# Patient Record
Sex: Female | Born: 1965 | Race: White | Hispanic: No | Marital: Married | Smoking: Current every day smoker
Health system: Southern US, Community
[De-identification: ages and names within clinical notes are randomized; demographics above are authoritative.]

## PROBLEM LIST (undated history)

## (undated) DIAGNOSIS — M0520 Rheumatoid vasculitis with rheumatoid arthritis of unspecified site: Secondary | ICD-10-CM

---

## 2017-05-27 DIAGNOSIS — Z13220 Encounter for screening for lipoid disorders: Secondary | ICD-10-CM | POA: Diagnosis not present

## 2017-05-27 DIAGNOSIS — M069 Rheumatoid arthritis, unspecified: Secondary | ICD-10-CM | POA: Diagnosis not present

## 2017-05-27 DIAGNOSIS — R69 Illness, unspecified: Secondary | ICD-10-CM | POA: Diagnosis not present

## 2017-05-27 DIAGNOSIS — G4700 Insomnia, unspecified: Secondary | ICD-10-CM | POA: Diagnosis not present

## 2017-06-28 DIAGNOSIS — Z79891 Long term (current) use of opiate analgesic: Secondary | ICD-10-CM | POA: Diagnosis not present

## 2017-06-28 DIAGNOSIS — M542 Cervicalgia: Secondary | ICD-10-CM | POA: Diagnosis not present

## 2017-06-28 DIAGNOSIS — G894 Chronic pain syndrome: Secondary | ICD-10-CM | POA: Diagnosis not present

## 2017-06-28 DIAGNOSIS — Z79899 Other long term (current) drug therapy: Secondary | ICD-10-CM | POA: Diagnosis not present

## 2017-07-10 DIAGNOSIS — R5383 Other fatigue: Secondary | ICD-10-CM | POA: Diagnosis not present

## 2017-07-10 DIAGNOSIS — R69 Illness, unspecified: Secondary | ICD-10-CM | POA: Diagnosis not present

## 2017-07-10 DIAGNOSIS — Z5181 Encounter for therapeutic drug level monitoring: Secondary | ICD-10-CM | POA: Diagnosis not present

## 2017-07-10 DIAGNOSIS — E782 Mixed hyperlipidemia: Secondary | ICD-10-CM | POA: Diagnosis not present

## 2017-07-10 DIAGNOSIS — L509 Urticaria, unspecified: Secondary | ICD-10-CM | POA: Diagnosis not present

## 2017-07-16 ENCOUNTER — Other Ambulatory Visit: Payer: Self-pay | Admitting: Physician Assistant

## 2017-07-16 ENCOUNTER — Ambulatory Visit
Admission: RE | Admit: 2017-07-16 | Discharge: 2017-07-16 | Disposition: A | Discharge status: Home / Self Care | Payer: Medicare HMO | Source: Ambulatory Visit | Attending: Physician Assistant | Admitting: Physician Assistant

## 2017-07-16 DIAGNOSIS — M542 Cervicalgia: Secondary | ICD-10-CM

## 2017-07-16 DIAGNOSIS — M4802 Spinal stenosis, cervical region: Secondary | ICD-10-CM | POA: Diagnosis not present

## 2017-07-16 DIAGNOSIS — M19041 Primary osteoarthritis, right hand: Secondary | ICD-10-CM | POA: Diagnosis not present

## 2017-07-16 DIAGNOSIS — M12842 Other specific arthropathies, not elsewhere classified, left hand: Secondary | ICD-10-CM | POA: Diagnosis not present

## 2017-07-18 DIAGNOSIS — M064 Inflammatory polyarthropathy: Secondary | ICD-10-CM | POA: Diagnosis not present

## 2017-07-18 DIAGNOSIS — M79643 Pain in unspecified hand: Secondary | ICD-10-CM | POA: Diagnosis not present

## 2017-07-18 DIAGNOSIS — M0579 Rheumatoid arthritis with rheumatoid factor of multiple sites without organ or systems involvement: Secondary | ICD-10-CM | POA: Diagnosis not present

## 2017-07-18 DIAGNOSIS — Z79899 Other long term (current) drug therapy: Secondary | ICD-10-CM | POA: Diagnosis not present

## 2017-07-18 DIAGNOSIS — L509 Urticaria, unspecified: Secondary | ICD-10-CM | POA: Diagnosis not present

## 2017-07-18 DIAGNOSIS — M0580 Other rheumatoid arthritis with rheumatoid factor of unspecified site: Secondary | ICD-10-CM | POA: Diagnosis not present

## 2017-07-30 DIAGNOSIS — Z79891 Long term (current) use of opiate analgesic: Secondary | ICD-10-CM | POA: Diagnosis not present

## 2017-07-30 DIAGNOSIS — Z79899 Other long term (current) drug therapy: Secondary | ICD-10-CM | POA: Diagnosis not present

## 2017-07-30 DIAGNOSIS — M542 Cervicalgia: Secondary | ICD-10-CM | POA: Diagnosis not present

## 2017-07-30 DIAGNOSIS — M47812 Spondylosis without myelopathy or radiculopathy, cervical region: Secondary | ICD-10-CM | POA: Diagnosis not present

## 2017-07-30 DIAGNOSIS — M069 Rheumatoid arthritis, unspecified: Secondary | ICD-10-CM | POA: Diagnosis not present

## 2017-07-30 DIAGNOSIS — G894 Chronic pain syndrome: Secondary | ICD-10-CM | POA: Diagnosis not present

## 2017-08-14 DIAGNOSIS — R5383 Other fatigue: Secondary | ICD-10-CM | POA: Diagnosis not present

## 2017-08-14 DIAGNOSIS — Z5181 Encounter for therapeutic drug level monitoring: Secondary | ICD-10-CM | POA: Diagnosis not present

## 2017-08-14 DIAGNOSIS — E782 Mixed hyperlipidemia: Secondary | ICD-10-CM | POA: Diagnosis not present

## 2017-08-21 DIAGNOSIS — J301 Allergic rhinitis due to pollen: Secondary | ICD-10-CM | POA: Diagnosis not present

## 2017-08-21 DIAGNOSIS — E782 Mixed hyperlipidemia: Secondary | ICD-10-CM | POA: Diagnosis not present

## 2017-08-27 DIAGNOSIS — M47812 Spondylosis without myelopathy or radiculopathy, cervical region: Secondary | ICD-10-CM | POA: Diagnosis not present

## 2017-08-27 DIAGNOSIS — G894 Chronic pain syndrome: Secondary | ICD-10-CM | POA: Diagnosis not present

## 2017-08-27 DIAGNOSIS — Z79899 Other long term (current) drug therapy: Secondary | ICD-10-CM | POA: Diagnosis not present

## 2017-08-27 DIAGNOSIS — M069 Rheumatoid arthritis, unspecified: Secondary | ICD-10-CM | POA: Diagnosis not present

## 2017-08-27 DIAGNOSIS — Z79891 Long term (current) use of opiate analgesic: Secondary | ICD-10-CM | POA: Diagnosis not present

## 2017-08-27 DIAGNOSIS — M542 Cervicalgia: Secondary | ICD-10-CM | POA: Diagnosis not present

## 2017-09-04 DIAGNOSIS — R69 Illness, unspecified: Secondary | ICD-10-CM | POA: Diagnosis not present

## 2017-09-17 DIAGNOSIS — L509 Urticaria, unspecified: Secondary | ICD-10-CM | POA: Diagnosis not present

## 2017-09-17 DIAGNOSIS — Z79899 Other long term (current) drug therapy: Secondary | ICD-10-CM | POA: Diagnosis not present

## 2017-09-17 DIAGNOSIS — M79643 Pain in unspecified hand: Secondary | ICD-10-CM | POA: Diagnosis not present

## 2017-09-17 DIAGNOSIS — M1990 Unspecified osteoarthritis, unspecified site: Secondary | ICD-10-CM | POA: Diagnosis not present

## 2017-09-17 DIAGNOSIS — M0579 Rheumatoid arthritis with rheumatoid factor of multiple sites without organ or systems involvement: Secondary | ICD-10-CM | POA: Diagnosis not present

## 2017-09-17 DIAGNOSIS — R110 Nausea: Secondary | ICD-10-CM | POA: Diagnosis not present

## 2017-09-24 DIAGNOSIS — M47812 Spondylosis without myelopathy or radiculopathy, cervical region: Secondary | ICD-10-CM | POA: Diagnosis not present

## 2017-09-24 DIAGNOSIS — G894 Chronic pain syndrome: Secondary | ICD-10-CM | POA: Diagnosis not present

## 2017-09-24 DIAGNOSIS — M542 Cervicalgia: Secondary | ICD-10-CM | POA: Diagnosis not present

## 2017-09-24 DIAGNOSIS — M069 Rheumatoid arthritis, unspecified: Secondary | ICD-10-CM | POA: Diagnosis not present

## 2017-10-22 DIAGNOSIS — M069 Rheumatoid arthritis, unspecified: Secondary | ICD-10-CM | POA: Diagnosis not present

## 2017-10-22 DIAGNOSIS — Z79891 Long term (current) use of opiate analgesic: Secondary | ICD-10-CM | POA: Diagnosis not present

## 2017-10-22 DIAGNOSIS — Z79899 Other long term (current) drug therapy: Secondary | ICD-10-CM | POA: Diagnosis not present

## 2017-10-22 DIAGNOSIS — G894 Chronic pain syndrome: Secondary | ICD-10-CM | POA: Diagnosis not present

## 2017-10-22 DIAGNOSIS — M47812 Spondylosis without myelopathy or radiculopathy, cervical region: Secondary | ICD-10-CM | POA: Diagnosis not present

## 2017-10-22 DIAGNOSIS — M542 Cervicalgia: Secondary | ICD-10-CM | POA: Diagnosis not present

## 2017-11-06 ENCOUNTER — Other Ambulatory Visit: Payer: Self-pay | Admitting: Internal Medicine

## 2017-11-06 DIAGNOSIS — Z139 Encounter for screening, unspecified: Secondary | ICD-10-CM

## 2017-11-20 DIAGNOSIS — M542 Cervicalgia: Secondary | ICD-10-CM | POA: Diagnosis not present

## 2017-11-20 DIAGNOSIS — M069 Rheumatoid arthritis, unspecified: Secondary | ICD-10-CM | POA: Diagnosis not present

## 2017-11-20 DIAGNOSIS — Z79899 Other long term (current) drug therapy: Secondary | ICD-10-CM | POA: Diagnosis not present

## 2017-11-20 DIAGNOSIS — G894 Chronic pain syndrome: Secondary | ICD-10-CM | POA: Diagnosis not present

## 2017-11-20 DIAGNOSIS — M47812 Spondylosis without myelopathy or radiculopathy, cervical region: Secondary | ICD-10-CM | POA: Diagnosis not present

## 2017-11-20 DIAGNOSIS — Z79891 Long term (current) use of opiate analgesic: Secondary | ICD-10-CM | POA: Diagnosis not present

## 2017-12-02 ENCOUNTER — Ambulatory Visit: Payer: Medicare HMO

## 2017-12-04 ENCOUNTER — Ambulatory Visit
Admission: RE | Admit: 2017-12-04 | Discharge: 2017-12-04 | Disposition: A | Discharge status: Home / Self Care | Payer: Medicare HMO | Source: Ambulatory Visit | Attending: Internal Medicine | Admitting: Internal Medicine

## 2017-12-04 DIAGNOSIS — Z1231 Encounter for screening mammogram for malignant neoplasm of breast: Secondary | ICD-10-CM | POA: Diagnosis not present

## 2017-12-04 DIAGNOSIS — Z139 Encounter for screening, unspecified: Secondary | ICD-10-CM

## 2017-12-17 DIAGNOSIS — M79603 Pain in arm, unspecified: Secondary | ICD-10-CM | POA: Diagnosis not present

## 2017-12-17 DIAGNOSIS — M1990 Unspecified osteoarthritis, unspecified site: Secondary | ICD-10-CM | POA: Diagnosis not present

## 2017-12-17 DIAGNOSIS — R110 Nausea: Secondary | ICD-10-CM | POA: Diagnosis not present

## 2017-12-17 DIAGNOSIS — M79643 Pain in unspecified hand: Secondary | ICD-10-CM | POA: Diagnosis not present

## 2017-12-17 DIAGNOSIS — Z79899 Other long term (current) drug therapy: Secondary | ICD-10-CM | POA: Diagnosis not present

## 2017-12-17 DIAGNOSIS — L509 Urticaria, unspecified: Secondary | ICD-10-CM | POA: Diagnosis not present

## 2017-12-17 DIAGNOSIS — M542 Cervicalgia: Secondary | ICD-10-CM | POA: Diagnosis not present

## 2017-12-17 DIAGNOSIS — M0579 Rheumatoid arthritis with rheumatoid factor of multiple sites without organ or systems involvement: Secondary | ICD-10-CM | POA: Diagnosis not present

## 2017-12-18 DIAGNOSIS — G894 Chronic pain syndrome: Secondary | ICD-10-CM | POA: Diagnosis not present

## 2017-12-18 DIAGNOSIS — M542 Cervicalgia: Secondary | ICD-10-CM | POA: Diagnosis not present

## 2017-12-18 DIAGNOSIS — Z79891 Long term (current) use of opiate analgesic: Secondary | ICD-10-CM | POA: Diagnosis not present

## 2017-12-18 DIAGNOSIS — M47812 Spondylosis without myelopathy or radiculopathy, cervical region: Secondary | ICD-10-CM | POA: Diagnosis not present

## 2017-12-18 DIAGNOSIS — M069 Rheumatoid arthritis, unspecified: Secondary | ICD-10-CM | POA: Diagnosis not present

## 2017-12-18 DIAGNOSIS — Z79899 Other long term (current) drug therapy: Secondary | ICD-10-CM | POA: Diagnosis not present

## 2018-01-03 DIAGNOSIS — E782 Mixed hyperlipidemia: Secondary | ICD-10-CM | POA: Diagnosis not present

## 2018-01-08 DIAGNOSIS — E6609 Other obesity due to excess calories: Secondary | ICD-10-CM | POA: Diagnosis not present

## 2018-01-08 DIAGNOSIS — E782 Mixed hyperlipidemia: Secondary | ICD-10-CM | POA: Diagnosis not present

## 2018-01-08 DIAGNOSIS — Z79899 Other long term (current) drug therapy: Secondary | ICD-10-CM | POA: Diagnosis not present

## 2018-01-08 DIAGNOSIS — R69 Illness, unspecified: Secondary | ICD-10-CM | POA: Diagnosis not present

## 2018-01-08 DIAGNOSIS — L821 Other seborrheic keratosis: Secondary | ICD-10-CM | POA: Diagnosis not present

## 2018-01-08 DIAGNOSIS — G44209 Tension-type headache, unspecified, not intractable: Secondary | ICD-10-CM | POA: Diagnosis not present

## 2018-01-15 DIAGNOSIS — M542 Cervicalgia: Secondary | ICD-10-CM | POA: Diagnosis not present

## 2018-01-15 DIAGNOSIS — G894 Chronic pain syndrome: Secondary | ICD-10-CM | POA: Diagnosis not present

## 2018-01-15 DIAGNOSIS — M47812 Spondylosis without myelopathy or radiculopathy, cervical region: Secondary | ICD-10-CM | POA: Diagnosis not present

## 2018-01-15 DIAGNOSIS — M069 Rheumatoid arthritis, unspecified: Secondary | ICD-10-CM | POA: Diagnosis not present

## 2018-01-21 DIAGNOSIS — H25013 Cortical age-related cataract, bilateral: Secondary | ICD-10-CM | POA: Diagnosis not present

## 2018-01-21 DIAGNOSIS — H3509 Other intraretinal microvascular abnormalities: Secondary | ICD-10-CM | POA: Diagnosis not present

## 2018-01-21 DIAGNOSIS — H2513 Age-related nuclear cataract, bilateral: Secondary | ICD-10-CM | POA: Diagnosis not present

## 2018-01-21 DIAGNOSIS — H53021 Refractive amblyopia, right eye: Secondary | ICD-10-CM | POA: Diagnosis not present

## 2018-01-31 DIAGNOSIS — R110 Nausea: Secondary | ICD-10-CM | POA: Diagnosis not present

## 2018-01-31 DIAGNOSIS — M79643 Pain in unspecified hand: Secondary | ICD-10-CM | POA: Diagnosis not present

## 2018-01-31 DIAGNOSIS — M0579 Rheumatoid arthritis with rheumatoid factor of multiple sites without organ or systems involvement: Secondary | ICD-10-CM | POA: Diagnosis not present

## 2018-01-31 DIAGNOSIS — Z79899 Other long term (current) drug therapy: Secondary | ICD-10-CM | POA: Diagnosis not present

## 2018-01-31 DIAGNOSIS — M1990 Unspecified osteoarthritis, unspecified site: Secondary | ICD-10-CM | POA: Diagnosis not present

## 2018-01-31 DIAGNOSIS — M79673 Pain in unspecified foot: Secondary | ICD-10-CM | POA: Diagnosis not present

## 2018-01-31 DIAGNOSIS — M79603 Pain in arm, unspecified: Secondary | ICD-10-CM | POA: Diagnosis not present

## 2018-01-31 DIAGNOSIS — M542 Cervicalgia: Secondary | ICD-10-CM | POA: Diagnosis not present

## 2018-01-31 DIAGNOSIS — L509 Urticaria, unspecified: Secondary | ICD-10-CM | POA: Diagnosis not present

## 2018-02-13 DIAGNOSIS — Z79899 Other long term (current) drug therapy: Secondary | ICD-10-CM | POA: Diagnosis not present

## 2018-02-13 DIAGNOSIS — M542 Cervicalgia: Secondary | ICD-10-CM | POA: Diagnosis not present

## 2018-02-13 DIAGNOSIS — M47812 Spondylosis without myelopathy or radiculopathy, cervical region: Secondary | ICD-10-CM | POA: Diagnosis not present

## 2018-02-13 DIAGNOSIS — M069 Rheumatoid arthritis, unspecified: Secondary | ICD-10-CM | POA: Diagnosis not present

## 2018-02-13 DIAGNOSIS — G894 Chronic pain syndrome: Secondary | ICD-10-CM | POA: Diagnosis not present

## 2018-02-13 DIAGNOSIS — Z79891 Long term (current) use of opiate analgesic: Secondary | ICD-10-CM | POA: Diagnosis not present

## 2018-03-03 DIAGNOSIS — M542 Cervicalgia: Secondary | ICD-10-CM | POA: Diagnosis not present

## 2018-03-05 DIAGNOSIS — M542 Cervicalgia: Secondary | ICD-10-CM | POA: Diagnosis not present

## 2018-03-12 DIAGNOSIS — M542 Cervicalgia: Secondary | ICD-10-CM | POA: Diagnosis not present

## 2018-03-13 DIAGNOSIS — G894 Chronic pain syndrome: Secondary | ICD-10-CM | POA: Diagnosis not present

## 2018-03-13 DIAGNOSIS — M542 Cervicalgia: Secondary | ICD-10-CM | POA: Diagnosis not present

## 2018-03-13 DIAGNOSIS — M069 Rheumatoid arthritis, unspecified: Secondary | ICD-10-CM | POA: Diagnosis not present

## 2018-03-13 DIAGNOSIS — M47812 Spondylosis without myelopathy or radiculopathy, cervical region: Secondary | ICD-10-CM | POA: Diagnosis not present

## 2018-03-17 DIAGNOSIS — M542 Cervicalgia: Secondary | ICD-10-CM | POA: Diagnosis not present

## 2018-03-20 DIAGNOSIS — M542 Cervicalgia: Secondary | ICD-10-CM | POA: Diagnosis not present

## 2018-04-03 DIAGNOSIS — M542 Cervicalgia: Secondary | ICD-10-CM | POA: Diagnosis not present

## 2018-04-04 DIAGNOSIS — R69 Illness, unspecified: Secondary | ICD-10-CM | POA: Diagnosis not present

## 2018-04-04 DIAGNOSIS — E6609 Other obesity due to excess calories: Secondary | ICD-10-CM | POA: Diagnosis not present

## 2018-04-04 DIAGNOSIS — E782 Mixed hyperlipidemia: Secondary | ICD-10-CM | POA: Diagnosis not present

## 2018-04-05 ENCOUNTER — Other Ambulatory Visit: Payer: Self-pay

## 2018-04-05 ENCOUNTER — Emergency Department (HOSPITAL_COMMUNITY): Payer: No Typology Code available for payment source

## 2018-04-05 ENCOUNTER — Emergency Department (HOSPITAL_COMMUNITY)
Admission: EM | Admit: 2018-04-05 | Discharge: 2018-04-05 | Disposition: A | Discharge status: Home / Self Care | Payer: No Typology Code available for payment source | Attending: Emergency Medicine | Admitting: Emergency Medicine

## 2018-04-05 ENCOUNTER — Encounter (HOSPITAL_COMMUNITY): Payer: Self-pay | Admitting: Emergency Medicine

## 2018-04-05 DIAGNOSIS — Y999 Unspecified external cause status: Secondary | ICD-10-CM | POA: Diagnosis not present

## 2018-04-05 DIAGNOSIS — Y939 Activity, unspecified: Secondary | ICD-10-CM | POA: Insufficient documentation

## 2018-04-05 DIAGNOSIS — Y929 Unspecified place or not applicable: Secondary | ICD-10-CM | POA: Insufficient documentation

## 2018-04-05 DIAGNOSIS — R0789 Other chest pain: Secondary | ICD-10-CM | POA: Diagnosis present

## 2018-04-05 DIAGNOSIS — S299XXA Unspecified injury of thorax, initial encounter: Secondary | ICD-10-CM | POA: Diagnosis not present

## 2018-04-05 DIAGNOSIS — M791 Myalgia, unspecified site: Secondary | ICD-10-CM | POA: Insufficient documentation

## 2018-04-05 DIAGNOSIS — F17210 Nicotine dependence, cigarettes, uncomplicated: Secondary | ICD-10-CM | POA: Insufficient documentation

## 2018-04-05 DIAGNOSIS — M7918 Myalgia, other site: Secondary | ICD-10-CM

## 2018-04-05 HISTORY — DX: Rheumatoid vasculitis with rheumatoid arthritis of unspecified site: M05.20

## 2018-04-05 MED ORDER — KETOROLAC TROMETHAMINE 15 MG/ML IJ SOLN
15.0000 mg | Freq: Once | INTRAMUSCULAR | Status: AC
  Administered 2018-04-05: 15 mg via INTRAMUSCULAR
  Filled 2018-04-05: qty 1

## 2018-04-05 MED ORDER — CYCLOBENZAPRINE HCL 10 MG PO TABS
10.0000 mg | ORAL_TABLET | Freq: Once | ORAL | Status: AC
  Administered 2018-04-05: 10 mg via ORAL
  Filled 2018-04-05: qty 1

## 2018-04-05 MED ORDER — OXYCODONE-ACETAMINOPHEN 5-325 MG PO TABS
1.0000 | ORAL_TABLET | Freq: Once | ORAL | Status: AC
  Administered 2018-04-05: 1 via ORAL
  Filled 2018-04-05: qty 1
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
## 2018-04-05 NOTE — Discharge Instructions (Signed)
Please take your normal pain medications and muscle relaxers at home to help relieve your symptoms.  You may also use warm and cool compresses to help with your symptoms.  Please follow-up with your primary care doctor and your rheumatologist later this week as already scheduled and return to the ER if you have any new or worsening symptoms in the meantime.

## 2018-04-09 DIAGNOSIS — M0579 Rheumatoid arthritis with rheumatoid factor of multiple sites without organ or systems involvement: Secondary | ICD-10-CM | POA: Diagnosis not present

## 2018-04-09 DIAGNOSIS — E782 Mixed hyperlipidemia: Secondary | ICD-10-CM | POA: Diagnosis not present

## 2018-04-09 DIAGNOSIS — Z79899 Other long term (current) drug therapy: Secondary | ICD-10-CM | POA: Diagnosis not present

## 2018-04-09 DIAGNOSIS — R7309 Other abnormal glucose: Secondary | ICD-10-CM | POA: Diagnosis not present

## 2018-04-10 DIAGNOSIS — M069 Rheumatoid arthritis, unspecified: Secondary | ICD-10-CM | POA: Diagnosis not present

## 2018-04-10 DIAGNOSIS — Z79891 Long term (current) use of opiate analgesic: Secondary | ICD-10-CM | POA: Diagnosis not present

## 2018-04-10 DIAGNOSIS — M47812 Spondylosis without myelopathy or radiculopathy, cervical region: Secondary | ICD-10-CM | POA: Diagnosis not present

## 2018-04-10 DIAGNOSIS — G894 Chronic pain syndrome: Secondary | ICD-10-CM | POA: Diagnosis not present

## 2018-04-11 DIAGNOSIS — Z79899 Other long term (current) drug therapy: Secondary | ICD-10-CM | POA: Diagnosis not present

## 2018-04-11 DIAGNOSIS — R110 Nausea: Secondary | ICD-10-CM | POA: Diagnosis not present

## 2018-04-11 DIAGNOSIS — M79673 Pain in unspecified foot: Secondary | ICD-10-CM | POA: Diagnosis not present

## 2018-04-11 DIAGNOSIS — L509 Urticaria, unspecified: Secondary | ICD-10-CM | POA: Diagnosis not present

## 2018-04-11 DIAGNOSIS — M542 Cervicalgia: Secondary | ICD-10-CM | POA: Diagnosis not present

## 2018-04-11 DIAGNOSIS — M79643 Pain in unspecified hand: Secondary | ICD-10-CM | POA: Diagnosis not present

## 2018-04-11 DIAGNOSIS — M1990 Unspecified osteoarthritis, unspecified site: Secondary | ICD-10-CM | POA: Diagnosis not present

## 2018-04-11 DIAGNOSIS — M79603 Pain in arm, unspecified: Secondary | ICD-10-CM | POA: Diagnosis not present

## 2018-04-11 DIAGNOSIS — M0579 Rheumatoid arthritis with rheumatoid factor of multiple sites without organ or systems involvement: Secondary | ICD-10-CM | POA: Diagnosis not present

## 2018-05-09 DIAGNOSIS — G894 Chronic pain syndrome: Secondary | ICD-10-CM | POA: Diagnosis not present

## 2018-05-09 DIAGNOSIS — M069 Rheumatoid arthritis, unspecified: Secondary | ICD-10-CM | POA: Diagnosis not present

## 2018-05-09 DIAGNOSIS — M47812 Spondylosis without myelopathy or radiculopathy, cervical region: Secondary | ICD-10-CM | POA: Diagnosis not present

## 2018-05-29 DIAGNOSIS — Q828 Other specified congenital malformations of skin: Secondary | ICD-10-CM | POA: Diagnosis not present

## 2018-05-29 DIAGNOSIS — L821 Other seborrheic keratosis: Secondary | ICD-10-CM | POA: Diagnosis not present

## 2018-05-29 DIAGNOSIS — L309 Dermatitis, unspecified: Secondary | ICD-10-CM | POA: Diagnosis not present

## 2018-05-29 DIAGNOSIS — L432 Lichenoid drug reaction: Secondary | ICD-10-CM | POA: Diagnosis not present

## 2018-06-09 DIAGNOSIS — G894 Chronic pain syndrome: Secondary | ICD-10-CM | POA: Diagnosis not present

## 2018-06-09 DIAGNOSIS — Z79899 Other long term (current) drug therapy: Secondary | ICD-10-CM | POA: Diagnosis not present

## 2018-06-09 DIAGNOSIS — M47812 Spondylosis without myelopathy or radiculopathy, cervical region: Secondary | ICD-10-CM | POA: Diagnosis not present

## 2018-06-09 DIAGNOSIS — Z79891 Long term (current) use of opiate analgesic: Secondary | ICD-10-CM | POA: Diagnosis not present

## 2018-06-16 ENCOUNTER — Other Ambulatory Visit: Payer: Self-pay | Admitting: Pain Medicine

## 2018-06-16 DIAGNOSIS — M542 Cervicalgia: Secondary | ICD-10-CM

## 2018-06-29 ENCOUNTER — Ambulatory Visit
Admission: RE | Admit: 2018-06-29 | Discharge: 2018-06-29 | Disposition: A | Discharge status: Home / Self Care | Payer: Medicare HMO | Source: Ambulatory Visit | Attending: Pain Medicine | Admitting: Pain Medicine

## 2018-06-29 DIAGNOSIS — M4802 Spinal stenosis, cervical region: Secondary | ICD-10-CM | POA: Diagnosis not present

## 2018-06-29 DIAGNOSIS — M542 Cervicalgia: Secondary | ICD-10-CM

## 2018-07-07 DIAGNOSIS — M069 Rheumatoid arthritis, unspecified: Secondary | ICD-10-CM | POA: Diagnosis not present

## 2018-07-07 DIAGNOSIS — G894 Chronic pain syndrome: Secondary | ICD-10-CM | POA: Diagnosis not present

## 2018-07-07 DIAGNOSIS — M47812 Spondylosis without myelopathy or radiculopathy, cervical region: Secondary | ICD-10-CM | POA: Diagnosis not present

## 2018-07-07 DIAGNOSIS — Z79899 Other long term (current) drug therapy: Secondary | ICD-10-CM | POA: Diagnosis not present

## 2018-07-16 DIAGNOSIS — Z0000 Encounter for general adult medical examination without abnormal findings: Secondary | ICD-10-CM | POA: Diagnosis not present

## 2018-07-16 DIAGNOSIS — Z79899 Other long term (current) drug therapy: Secondary | ICD-10-CM | POA: Diagnosis not present

## 2018-07-16 DIAGNOSIS — E6609 Other obesity due to excess calories: Secondary | ICD-10-CM | POA: Diagnosis not present

## 2018-07-16 DIAGNOSIS — E782 Mixed hyperlipidemia: Secondary | ICD-10-CM | POA: Diagnosis not present

## 2018-07-16 DIAGNOSIS — M0579 Rheumatoid arthritis with rheumatoid factor of multiple sites without organ or systems involvement: Secondary | ICD-10-CM | POA: Diagnosis not present

## 2018-07-16 DIAGNOSIS — R7309 Other abnormal glucose: Secondary | ICD-10-CM | POA: Diagnosis not present

## 2018-07-16 DIAGNOSIS — R5383 Other fatigue: Secondary | ICD-10-CM | POA: Diagnosis not present

## 2018-07-18 DIAGNOSIS — M1990 Unspecified osteoarthritis, unspecified site: Secondary | ICD-10-CM | POA: Diagnosis not present

## 2018-07-18 DIAGNOSIS — M542 Cervicalgia: Secondary | ICD-10-CM | POA: Diagnosis not present

## 2018-07-18 DIAGNOSIS — Z79899 Other long term (current) drug therapy: Secondary | ICD-10-CM | POA: Diagnosis not present

## 2018-07-18 DIAGNOSIS — M79643 Pain in unspecified hand: Secondary | ICD-10-CM | POA: Diagnosis not present

## 2018-07-18 DIAGNOSIS — M79673 Pain in unspecified foot: Secondary | ICD-10-CM | POA: Diagnosis not present

## 2018-07-18 DIAGNOSIS — M0579 Rheumatoid arthritis with rheumatoid factor of multiple sites without organ or systems involvement: Secondary | ICD-10-CM | POA: Diagnosis not present

## 2018-07-18 DIAGNOSIS — R69 Illness, unspecified: Secondary | ICD-10-CM | POA: Diagnosis not present

## 2018-07-18 DIAGNOSIS — L509 Urticaria, unspecified: Secondary | ICD-10-CM | POA: Diagnosis not present

## 2018-07-18 DIAGNOSIS — R110 Nausea: Secondary | ICD-10-CM | POA: Diagnosis not present

## 2018-07-23 DIAGNOSIS — Z23 Encounter for immunization: Secondary | ICD-10-CM | POA: Diagnosis not present

## 2018-07-23 DIAGNOSIS — E6609 Other obesity due to excess calories: Secondary | ICD-10-CM | POA: Diagnosis not present

## 2018-07-23 DIAGNOSIS — K760 Fatty (change of) liver, not elsewhere classified: Secondary | ICD-10-CM | POA: Diagnosis not present

## 2018-07-23 DIAGNOSIS — K219 Gastro-esophageal reflux disease without esophagitis: Secondary | ICD-10-CM | POA: Diagnosis not present

## 2018-07-23 DIAGNOSIS — R7309 Other abnormal glucose: Secondary | ICD-10-CM | POA: Diagnosis not present

## 2018-07-23 DIAGNOSIS — Z0000 Encounter for general adult medical examination without abnormal findings: Secondary | ICD-10-CM | POA: Diagnosis not present

## 2018-07-23 DIAGNOSIS — R69 Illness, unspecified: Secondary | ICD-10-CM | POA: Diagnosis not present

## 2018-07-23 DIAGNOSIS — E782 Mixed hyperlipidemia: Secondary | ICD-10-CM | POA: Diagnosis not present

## 2018-07-23 DIAGNOSIS — M0579 Rheumatoid arthritis with rheumatoid factor of multiple sites without organ or systems involvement: Secondary | ICD-10-CM | POA: Diagnosis not present

## 2018-07-29 DIAGNOSIS — M79609 Pain in unspecified limb: Secondary | ICD-10-CM | POA: Diagnosis not present

## 2018-07-29 DIAGNOSIS — M5412 Radiculopathy, cervical region: Secondary | ICD-10-CM | POA: Diagnosis not present

## 2018-08-05 DIAGNOSIS — R69 Illness, unspecified: Secondary | ICD-10-CM | POA: Diagnosis not present

## 2018-08-05 DIAGNOSIS — Z01419 Encounter for gynecological examination (general) (routine) without abnormal findings: Secondary | ICD-10-CM | POA: Diagnosis not present

## 2018-08-05 DIAGNOSIS — M0579 Rheumatoid arthritis with rheumatoid factor of multiple sites without organ or systems involvement: Secondary | ICD-10-CM | POA: Diagnosis not present

## 2018-08-05 DIAGNOSIS — R7309 Other abnormal glucose: Secondary | ICD-10-CM | POA: Diagnosis not present

## 2018-08-05 DIAGNOSIS — E782 Mixed hyperlipidemia: Secondary | ICD-10-CM | POA: Diagnosis not present

## 2018-08-08 DIAGNOSIS — M069 Rheumatoid arthritis, unspecified: Secondary | ICD-10-CM | POA: Diagnosis not present

## 2018-08-08 DIAGNOSIS — M47812 Spondylosis without myelopathy or radiculopathy, cervical region: Secondary | ICD-10-CM | POA: Diagnosis not present

## 2018-08-08 DIAGNOSIS — M542 Cervicalgia: Secondary | ICD-10-CM | POA: Diagnosis not present

## 2018-08-08 DIAGNOSIS — G894 Chronic pain syndrome: Secondary | ICD-10-CM | POA: Diagnosis not present

## 2018-08-13 DIAGNOSIS — M47812 Spondylosis without myelopathy or radiculopathy, cervical region: Secondary | ICD-10-CM | POA: Diagnosis not present

## 2018-08-13 DIAGNOSIS — M542 Cervicalgia: Secondary | ICD-10-CM | POA: Diagnosis not present

## 2018-08-13 DIAGNOSIS — G894 Chronic pain syndrome: Secondary | ICD-10-CM | POA: Diagnosis not present

## 2018-08-20 DIAGNOSIS — G5600 Carpal tunnel syndrome, unspecified upper limb: Secondary | ICD-10-CM | POA: Diagnosis not present

## 2018-08-20 DIAGNOSIS — M47812 Spondylosis without myelopathy or radiculopathy, cervical region: Secondary | ICD-10-CM | POA: Diagnosis not present

## 2018-09-04 DIAGNOSIS — Z79899 Other long term (current) drug therapy: Secondary | ICD-10-CM | POA: Diagnosis not present

## 2018-09-04 DIAGNOSIS — G894 Chronic pain syndrome: Secondary | ICD-10-CM | POA: Diagnosis not present

## 2018-09-04 DIAGNOSIS — M069 Rheumatoid arthritis, unspecified: Secondary | ICD-10-CM | POA: Diagnosis not present

## 2018-09-04 DIAGNOSIS — G5600 Carpal tunnel syndrome, unspecified upper limb: Secondary | ICD-10-CM | POA: Diagnosis not present

## 2018-09-04 DIAGNOSIS — M47812 Spondylosis without myelopathy or radiculopathy, cervical region: Secondary | ICD-10-CM | POA: Diagnosis not present

## 2018-09-04 DIAGNOSIS — Z79891 Long term (current) use of opiate analgesic: Secondary | ICD-10-CM | POA: Diagnosis not present

## 2018-10-06 DIAGNOSIS — G894 Chronic pain syndrome: Secondary | ICD-10-CM | POA: Diagnosis not present

## 2018-10-06 DIAGNOSIS — M069 Rheumatoid arthritis, unspecified: Secondary | ICD-10-CM | POA: Diagnosis not present

## 2018-10-06 DIAGNOSIS — M47812 Spondylosis without myelopathy or radiculopathy, cervical region: Secondary | ICD-10-CM | POA: Diagnosis not present

## 2018-10-06 DIAGNOSIS — G5600 Carpal tunnel syndrome, unspecified upper limb: Secondary | ICD-10-CM | POA: Diagnosis not present

## 2018-10-06 DIAGNOSIS — N390 Urinary tract infection, site not specified: Secondary | ICD-10-CM | POA: Diagnosis not present

## 2018-11-06 DIAGNOSIS — G5600 Carpal tunnel syndrome, unspecified upper limb: Secondary | ICD-10-CM | POA: Diagnosis not present

## 2018-11-06 DIAGNOSIS — Z79891 Long term (current) use of opiate analgesic: Secondary | ICD-10-CM | POA: Diagnosis not present

## 2018-11-06 DIAGNOSIS — M47812 Spondylosis without myelopathy or radiculopathy, cervical region: Secondary | ICD-10-CM | POA: Diagnosis not present

## 2018-11-06 DIAGNOSIS — M069 Rheumatoid arthritis, unspecified: Secondary | ICD-10-CM | POA: Diagnosis not present

## 2018-11-06 DIAGNOSIS — Z79899 Other long term (current) drug therapy: Secondary | ICD-10-CM | POA: Diagnosis not present

## 2018-11-06 DIAGNOSIS — G894 Chronic pain syndrome: Secondary | ICD-10-CM | POA: Diagnosis not present

## 2018-12-04 DIAGNOSIS — M47812 Spondylosis without myelopathy or radiculopathy, cervical region: Secondary | ICD-10-CM | POA: Diagnosis not present

## 2018-12-04 DIAGNOSIS — M069 Rheumatoid arthritis, unspecified: Secondary | ICD-10-CM | POA: Diagnosis not present

## 2018-12-04 DIAGNOSIS — G5600 Carpal tunnel syndrome, unspecified upper limb: Secondary | ICD-10-CM | POA: Diagnosis not present

## 2018-12-04 DIAGNOSIS — G894 Chronic pain syndrome: Secondary | ICD-10-CM | POA: Diagnosis not present

## 2018-12-15 DIAGNOSIS — R69 Illness, unspecified: Secondary | ICD-10-CM | POA: Diagnosis not present

## 2018-12-15 DIAGNOSIS — R7309 Other abnormal glucose: Secondary | ICD-10-CM | POA: Diagnosis not present

## 2018-12-15 DIAGNOSIS — E782 Mixed hyperlipidemia: Secondary | ICD-10-CM | POA: Diagnosis not present

## 2018-12-18 ENCOUNTER — Other Ambulatory Visit: Payer: Self-pay | Admitting: Internal Medicine

## 2018-12-18 ENCOUNTER — Other Ambulatory Visit: Payer: Self-pay | Admitting: Neurology

## 2018-12-18 DIAGNOSIS — Z1231 Encounter for screening mammogram for malignant neoplasm of breast: Secondary | ICD-10-CM

## 2018-12-19 DIAGNOSIS — R69 Illness, unspecified: Secondary | ICD-10-CM | POA: Diagnosis not present

## 2018-12-19 DIAGNOSIS — R7309 Other abnormal glucose: Secondary | ICD-10-CM | POA: Diagnosis not present

## 2018-12-19 DIAGNOSIS — K760 Fatty (change of) liver, not elsewhere classified: Secondary | ICD-10-CM | POA: Diagnosis not present

## 2018-12-19 DIAGNOSIS — E782 Mixed hyperlipidemia: Secondary | ICD-10-CM | POA: Diagnosis not present

## 2018-12-22 ENCOUNTER — Ambulatory Visit: Payer: Medicare HMO

## 2019-01-01 DIAGNOSIS — M069 Rheumatoid arthritis, unspecified: Secondary | ICD-10-CM | POA: Diagnosis not present

## 2019-01-01 DIAGNOSIS — G894 Chronic pain syndrome: Secondary | ICD-10-CM | POA: Diagnosis not present

## 2019-01-01 DIAGNOSIS — Z79891 Long term (current) use of opiate analgesic: Secondary | ICD-10-CM | POA: Diagnosis not present

## 2019-01-01 DIAGNOSIS — Z79899 Other long term (current) drug therapy: Secondary | ICD-10-CM | POA: Diagnosis not present

## 2019-01-01 DIAGNOSIS — M47812 Spondylosis without myelopathy or radiculopathy, cervical region: Secondary | ICD-10-CM | POA: Diagnosis not present

## 2019-01-01 DIAGNOSIS — G5600 Carpal tunnel syndrome, unspecified upper limb: Secondary | ICD-10-CM | POA: Diagnosis not present

## 2019-01-08 ENCOUNTER — Ambulatory Visit
Admission: RE | Admit: 2019-01-08 | Discharge: 2019-01-08 | Disposition: A | Discharge status: Home / Self Care | Payer: Medicare HMO | Source: Ambulatory Visit | Attending: Internal Medicine | Admitting: Internal Medicine

## 2019-01-08 DIAGNOSIS — Z1231 Encounter for screening mammogram for malignant neoplasm of breast: Secondary | ICD-10-CM

## 2019-01-14 ENCOUNTER — Ambulatory Visit: Payer: Medicare HMO

## 2019-01-29 DIAGNOSIS — G5600 Carpal tunnel syndrome, unspecified upper limb: Secondary | ICD-10-CM | POA: Diagnosis not present

## 2019-01-29 DIAGNOSIS — M069 Rheumatoid arthritis, unspecified: Secondary | ICD-10-CM | POA: Diagnosis not present

## 2019-01-29 DIAGNOSIS — G894 Chronic pain syndrome: Secondary | ICD-10-CM | POA: Diagnosis not present

## 2019-01-29 DIAGNOSIS — M47812 Spondylosis without myelopathy or radiculopathy, cervical region: Secondary | ICD-10-CM | POA: Diagnosis not present

## 2019-03-02 DIAGNOSIS — M79643 Pain in unspecified hand: Secondary | ICD-10-CM | POA: Diagnosis not present

## 2019-03-02 DIAGNOSIS — L509 Urticaria, unspecified: Secondary | ICD-10-CM | POA: Diagnosis not present

## 2019-03-02 DIAGNOSIS — Z79899 Other long term (current) drug therapy: Secondary | ICD-10-CM | POA: Diagnosis not present

## 2019-03-02 DIAGNOSIS — M0579 Rheumatoid arthritis with rheumatoid factor of multiple sites without organ or systems involvement: Secondary | ICD-10-CM | POA: Diagnosis not present

## 2019-03-02 DIAGNOSIS — M1990 Unspecified osteoarthritis, unspecified site: Secondary | ICD-10-CM | POA: Diagnosis not present

## 2019-03-02 DIAGNOSIS — R5383 Other fatigue: Secondary | ICD-10-CM | POA: Diagnosis not present

## 2019-03-02 DIAGNOSIS — R110 Nausea: Secondary | ICD-10-CM | POA: Diagnosis not present

## 2019-03-02 DIAGNOSIS — M79673 Pain in unspecified foot: Secondary | ICD-10-CM | POA: Diagnosis not present

## 2019-03-02 DIAGNOSIS — R69 Illness, unspecified: Secondary | ICD-10-CM | POA: Diagnosis not present

## 2019-03-02 DIAGNOSIS — M542 Cervicalgia: Secondary | ICD-10-CM | POA: Diagnosis not present

## 2019-03-05 DIAGNOSIS — G5600 Carpal tunnel syndrome, unspecified upper limb: Secondary | ICD-10-CM | POA: Diagnosis not present

## 2019-03-05 DIAGNOSIS — M069 Rheumatoid arthritis, unspecified: Secondary | ICD-10-CM | POA: Diagnosis not present

## 2019-03-05 DIAGNOSIS — Z79899 Other long term (current) drug therapy: Secondary | ICD-10-CM | POA: Diagnosis not present

## 2019-03-05 DIAGNOSIS — M0579 Rheumatoid arthritis with rheumatoid factor of multiple sites without organ or systems involvement: Secondary | ICD-10-CM | POA: Diagnosis not present

## 2019-03-05 DIAGNOSIS — M47812 Spondylosis without myelopathy or radiculopathy, cervical region: Secondary | ICD-10-CM | POA: Diagnosis not present

## 2019-03-05 DIAGNOSIS — G894 Chronic pain syndrome: Secondary | ICD-10-CM | POA: Diagnosis not present

## 2019-04-02 DIAGNOSIS — M069 Rheumatoid arthritis, unspecified: Secondary | ICD-10-CM | POA: Diagnosis not present

## 2019-04-02 DIAGNOSIS — Z79899 Other long term (current) drug therapy: Secondary | ICD-10-CM | POA: Diagnosis not present

## 2019-04-02 DIAGNOSIS — Z79891 Long term (current) use of opiate analgesic: Secondary | ICD-10-CM | POA: Diagnosis not present

## 2019-04-02 DIAGNOSIS — G5600 Carpal tunnel syndrome, unspecified upper limb: Secondary | ICD-10-CM | POA: Diagnosis not present

## 2019-04-02 DIAGNOSIS — M47812 Spondylosis without myelopathy or radiculopathy, cervical region: Secondary | ICD-10-CM | POA: Diagnosis not present

## 2019-04-02 DIAGNOSIS — G894 Chronic pain syndrome: Secondary | ICD-10-CM | POA: Diagnosis not present

## 2019-04-30 DIAGNOSIS — G5600 Carpal tunnel syndrome, unspecified upper limb: Secondary | ICD-10-CM | POA: Diagnosis not present

## 2019-04-30 DIAGNOSIS — M069 Rheumatoid arthritis, unspecified: Secondary | ICD-10-CM | POA: Diagnosis not present

## 2019-04-30 DIAGNOSIS — G894 Chronic pain syndrome: Secondary | ICD-10-CM | POA: Diagnosis not present

## 2019-04-30 DIAGNOSIS — M47812 Spondylosis without myelopathy or radiculopathy, cervical region: Secondary | ICD-10-CM | POA: Diagnosis not present

## 2019-05-19 DIAGNOSIS — L918 Other hypertrophic disorders of the skin: Secondary | ICD-10-CM | POA: Diagnosis not present

## 2019-05-19 DIAGNOSIS — Q828 Other specified congenital malformations of skin: Secondary | ICD-10-CM | POA: Diagnosis not present

## 2019-05-19 DIAGNOSIS — L821 Other seborrheic keratosis: Secondary | ICD-10-CM | POA: Diagnosis not present

## 2019-05-28 DIAGNOSIS — M069 Rheumatoid arthritis, unspecified: Secondary | ICD-10-CM | POA: Diagnosis not present

## 2019-05-28 DIAGNOSIS — G894 Chronic pain syndrome: Secondary | ICD-10-CM | POA: Diagnosis not present

## 2019-05-28 DIAGNOSIS — M2550 Pain in unspecified joint: Secondary | ICD-10-CM | POA: Diagnosis not present

## 2019-05-28 DIAGNOSIS — M47812 Spondylosis without myelopathy or radiculopathy, cervical region: Secondary | ICD-10-CM | POA: Diagnosis not present

## 2019-06-03 DIAGNOSIS — R69 Illness, unspecified: Secondary | ICD-10-CM | POA: Diagnosis not present

## 2019-06-03 DIAGNOSIS — M1990 Unspecified osteoarthritis, unspecified site: Secondary | ICD-10-CM | POA: Diagnosis not present

## 2019-06-03 DIAGNOSIS — L509 Urticaria, unspecified: Secondary | ICD-10-CM | POA: Diagnosis not present

## 2019-06-03 DIAGNOSIS — R635 Abnormal weight gain: Secondary | ICD-10-CM | POA: Diagnosis not present

## 2019-06-03 DIAGNOSIS — M79643 Pain in unspecified hand: Secondary | ICD-10-CM | POA: Diagnosis not present

## 2019-06-03 DIAGNOSIS — R5383 Other fatigue: Secondary | ICD-10-CM | POA: Diagnosis not present

## 2019-06-03 DIAGNOSIS — Z79899 Other long term (current) drug therapy: Secondary | ICD-10-CM | POA: Diagnosis not present

## 2019-06-03 DIAGNOSIS — M0579 Rheumatoid arthritis with rheumatoid factor of multiple sites without organ or systems involvement: Secondary | ICD-10-CM | POA: Diagnosis not present

## 2019-06-03 DIAGNOSIS — M79673 Pain in unspecified foot: Secondary | ICD-10-CM | POA: Diagnosis not present

## 2019-06-03 DIAGNOSIS — R51 Headache: Secondary | ICD-10-CM | POA: Diagnosis not present

## 2019-06-25 DIAGNOSIS — G894 Chronic pain syndrome: Secondary | ICD-10-CM | POA: Diagnosis not present

## 2019-06-25 DIAGNOSIS — M069 Rheumatoid arthritis, unspecified: Secondary | ICD-10-CM | POA: Diagnosis not present

## 2019-06-25 DIAGNOSIS — M2550 Pain in unspecified joint: Secondary | ICD-10-CM | POA: Diagnosis not present

## 2019-06-25 DIAGNOSIS — M47812 Spondylosis without myelopathy or radiculopathy, cervical region: Secondary | ICD-10-CM | POA: Diagnosis not present

## 2019-07-24 DIAGNOSIS — M069 Rheumatoid arthritis, unspecified: Secondary | ICD-10-CM | POA: Diagnosis not present

## 2019-07-24 DIAGNOSIS — G894 Chronic pain syndrome: Secondary | ICD-10-CM | POA: Diagnosis not present

## 2019-07-24 DIAGNOSIS — G5600 Carpal tunnel syndrome, unspecified upper limb: Secondary | ICD-10-CM | POA: Diagnosis not present

## 2019-07-24 DIAGNOSIS — Z79891 Long term (current) use of opiate analgesic: Secondary | ICD-10-CM | POA: Diagnosis not present

## 2019-07-24 DIAGNOSIS — Z79899 Other long term (current) drug therapy: Secondary | ICD-10-CM | POA: Diagnosis not present

## 2019-07-24 DIAGNOSIS — M47812 Spondylosis without myelopathy or radiculopathy, cervical region: Secondary | ICD-10-CM | POA: Diagnosis not present
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
## 2019-08-12 DIAGNOSIS — R7309 Other abnormal glucose: Secondary | ICD-10-CM | POA: Diagnosis not present

## 2019-08-12 DIAGNOSIS — Z79899 Other long term (current) drug therapy: Secondary | ICD-10-CM | POA: Diagnosis not present

## 2019-08-12 DIAGNOSIS — Z0000 Encounter for general adult medical examination without abnormal findings: Secondary | ICD-10-CM | POA: Diagnosis not present

## 2019-08-12 DIAGNOSIS — E782 Mixed hyperlipidemia: Secondary | ICD-10-CM | POA: Diagnosis not present

## 2019-08-14 DIAGNOSIS — K219 Gastro-esophageal reflux disease without esophagitis: Secondary | ICD-10-CM | POA: Diagnosis not present

## 2019-08-14 DIAGNOSIS — J301 Allergic rhinitis due to pollen: Secondary | ICD-10-CM | POA: Diagnosis not present

## 2019-08-14 DIAGNOSIS — E6609 Other obesity due to excess calories: Secondary | ICD-10-CM | POA: Diagnosis not present

## 2019-08-14 DIAGNOSIS — M0579 Rheumatoid arthritis with rheumatoid factor of multiple sites without organ or systems involvement: Secondary | ICD-10-CM | POA: Diagnosis not present

## 2019-08-14 DIAGNOSIS — E782 Mixed hyperlipidemia: Secondary | ICD-10-CM | POA: Diagnosis not present

## 2019-08-14 DIAGNOSIS — R69 Illness, unspecified: Secondary | ICD-10-CM | POA: Diagnosis not present

## 2019-08-14 DIAGNOSIS — K760 Fatty (change of) liver, not elsewhere classified: Secondary | ICD-10-CM | POA: Diagnosis not present

## 2019-08-14 DIAGNOSIS — R7309 Other abnormal glucose: Secondary | ICD-10-CM | POA: Diagnosis not present

## 2019-08-14 DIAGNOSIS — Z0000 Encounter for general adult medical examination without abnormal findings: Secondary | ICD-10-CM | POA: Diagnosis not present

## 2019-08-21 DIAGNOSIS — G5600 Carpal tunnel syndrome, unspecified upper limb: Secondary | ICD-10-CM | POA: Diagnosis not present

## 2019-08-21 DIAGNOSIS — M069 Rheumatoid arthritis, unspecified: Secondary | ICD-10-CM | POA: Diagnosis not present

## 2019-08-21 DIAGNOSIS — G894 Chronic pain syndrome: Secondary | ICD-10-CM | POA: Diagnosis not present

## 2019-08-21 DIAGNOSIS — M47812 Spondylosis without myelopathy or radiculopathy, cervical region: Secondary | ICD-10-CM | POA: Diagnosis not present

## 2019-09-18 DIAGNOSIS — G5600 Carpal tunnel syndrome, unspecified upper limb: Secondary | ICD-10-CM | POA: Diagnosis not present

## 2019-09-18 DIAGNOSIS — M47812 Spondylosis without myelopathy or radiculopathy, cervical region: Secondary | ICD-10-CM | POA: Diagnosis not present

## 2019-09-18 DIAGNOSIS — M069 Rheumatoid arthritis, unspecified: Secondary | ICD-10-CM | POA: Diagnosis not present

## 2019-09-18 DIAGNOSIS — G894 Chronic pain syndrome: Secondary | ICD-10-CM | POA: Diagnosis not present

## 2019-09-23 DIAGNOSIS — K5730 Diverticulosis of large intestine without perforation or abscess without bleeding: Secondary | ICD-10-CM | POA: Diagnosis not present

## 2019-09-23 DIAGNOSIS — R110 Nausea: Secondary | ICD-10-CM | POA: Diagnosis not present

## 2019-09-23 DIAGNOSIS — K219 Gastro-esophageal reflux disease without esophagitis: Secondary | ICD-10-CM | POA: Diagnosis not present

## 2019-09-23 DIAGNOSIS — R6881 Early satiety: Secondary | ICD-10-CM | POA: Diagnosis not present

## 2019-09-23 DIAGNOSIS — R140 Abdominal distension (gaseous): Secondary | ICD-10-CM | POA: Diagnosis not present

## 2019-09-23 DIAGNOSIS — R1310 Dysphagia, unspecified: Secondary | ICD-10-CM | POA: Diagnosis not present

## 2019-09-23 DIAGNOSIS — R159 Full incontinence of feces: Secondary | ICD-10-CM | POA: Diagnosis not present

## 2019-09-23 DIAGNOSIS — R194 Change in bowel habit: Secondary | ICD-10-CM | POA: Diagnosis not present

## 2019-09-23 DIAGNOSIS — R1012 Left upper quadrant pain: Secondary | ICD-10-CM | POA: Diagnosis not present

## 2019-09-23 DIAGNOSIS — Z8719 Personal history of other diseases of the digestive system: Secondary | ICD-10-CM | POA: Diagnosis not present

## 2019-09-24 ENCOUNTER — Other Ambulatory Visit: Payer: Self-pay | Admitting: Gastroenterology

## 2019-09-24 DIAGNOSIS — R1012 Left upper quadrant pain: Secondary | ICD-10-CM

## 2019-09-29 DIAGNOSIS — K591 Functional diarrhea: Secondary | ICD-10-CM | POA: Diagnosis not present

## 2019-09-29 DIAGNOSIS — R197 Diarrhea, unspecified: Secondary | ICD-10-CM | POA: Diagnosis not present

## 2019-09-29 DIAGNOSIS — R1010 Upper abdominal pain, unspecified: Secondary | ICD-10-CM | POA: Diagnosis not present

## 2019-09-29 DIAGNOSIS — R1310 Dysphagia, unspecified: Secondary | ICD-10-CM | POA: Diagnosis not present

## 2019-10-02 DIAGNOSIS — R1013 Epigastric pain: Secondary | ICD-10-CM | POA: Diagnosis not present

## 2019-10-02 DIAGNOSIS — R159 Full incontinence of feces: Secondary | ICD-10-CM | POA: Diagnosis not present

## 2019-10-02 DIAGNOSIS — R1310 Dysphagia, unspecified: Secondary | ICD-10-CM | POA: Diagnosis not present

## 2019-10-02 DIAGNOSIS — K5730 Diverticulosis of large intestine without perforation or abscess without bleeding: Secondary | ICD-10-CM | POA: Diagnosis not present

## 2019-10-09 DIAGNOSIS — R1010 Upper abdominal pain, unspecified: Secondary | ICD-10-CM | POA: Diagnosis not present

## 2019-10-21 DIAGNOSIS — M79643 Pain in unspecified hand: Secondary | ICD-10-CM | POA: Diagnosis not present

## 2019-10-21 DIAGNOSIS — M1990 Unspecified osteoarthritis, unspecified site: Secondary | ICD-10-CM | POA: Diagnosis not present

## 2019-10-21 DIAGNOSIS — M0579 Rheumatoid arthritis with rheumatoid factor of multiple sites without organ or systems involvement: Secondary | ICD-10-CM | POA: Diagnosis not present

## 2019-10-21 DIAGNOSIS — R5383 Other fatigue: Secondary | ICD-10-CM | POA: Diagnosis not present

## 2019-10-21 DIAGNOSIS — L509 Urticaria, unspecified: Secondary | ICD-10-CM | POA: Diagnosis not present

## 2019-10-21 DIAGNOSIS — M79673 Pain in unspecified foot: Secondary | ICD-10-CM | POA: Diagnosis not present

## 2019-10-21 DIAGNOSIS — Z79899 Other long term (current) drug therapy: Secondary | ICD-10-CM | POA: Diagnosis not present

## 2019-10-21 IMAGING — MR MR CERVICAL SPINE W/O CM
4 of 5 series · 20 of 48 positions shown · non-contrast
Comparison: Previous radiograph from 07/16/2017.

CLINICAL DATA: Initial evaluation for bilateral neck pain extending
into the upper extremities bilaterally with numbness and weakness.
Prior fusion.

EXAM:
MRI CERVICAL SPINE WITHOUT CONTRAST
TECHNIQUE: Multiplanar, multisequence MR imaging of the cervical spine was
performed. No intravenous contrast was administered.

[Series 3: T2 · sagittal · 3.0mm · 0.41mm/px · 8 of 17 slices shown (1 of 3)]
[im 1/17]
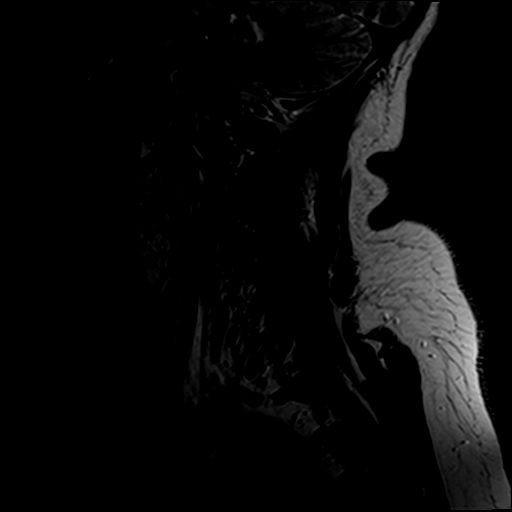
[im 3/17]
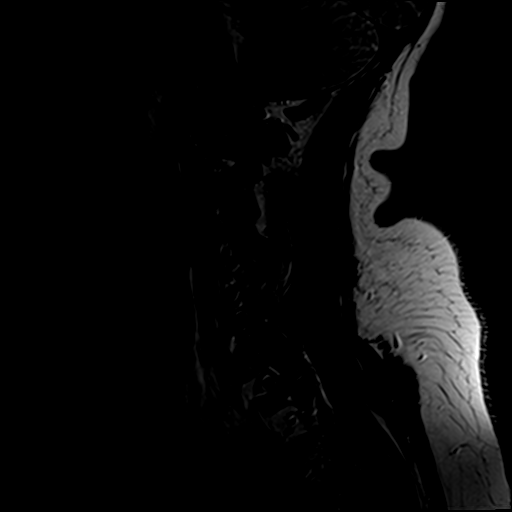
[im 5/17]
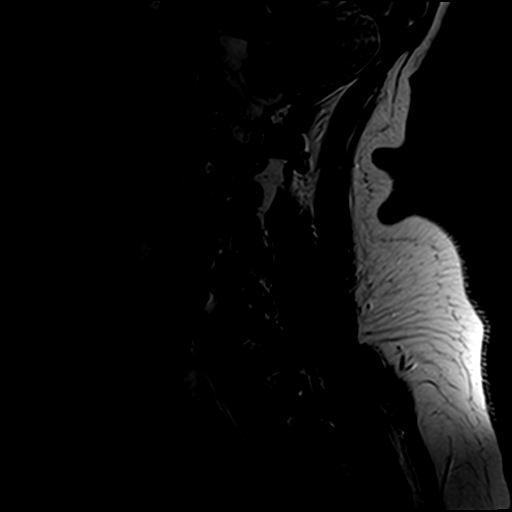
[im 7/17]
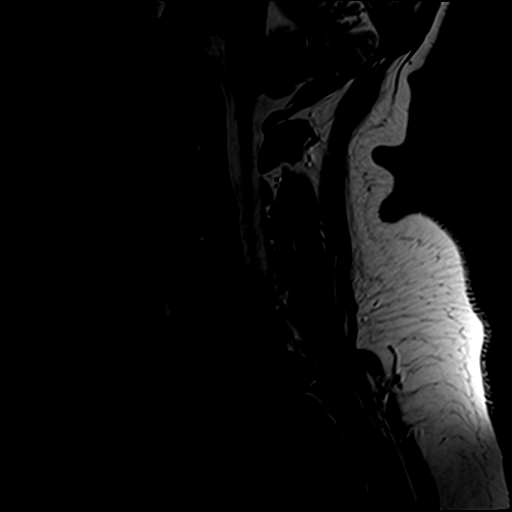
[im 10/17]
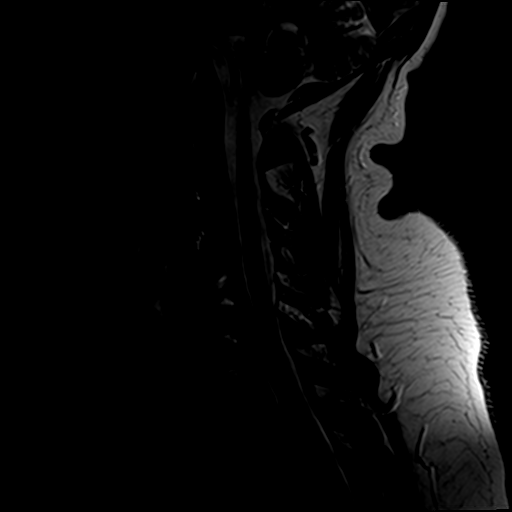
[im 12/17]
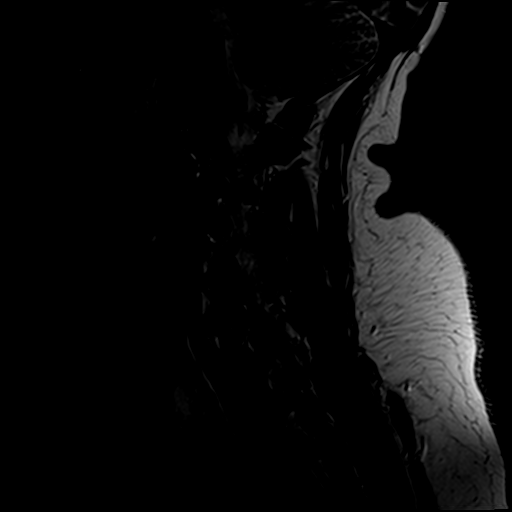
[im 14/17]
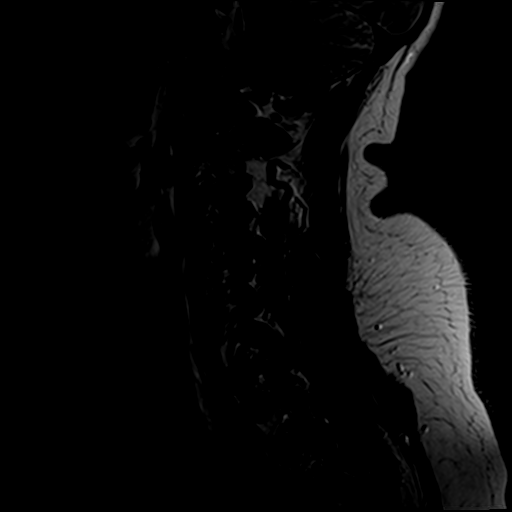
[im 17/17]
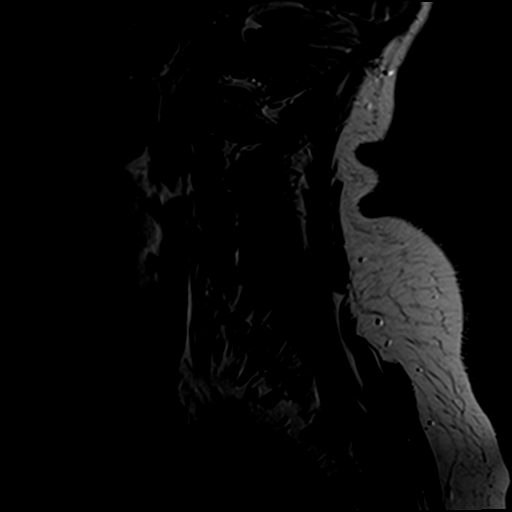

[Series 4: T1 · sagittal · 3.0mm · 0.41mm/px · 3 of 17 slices shown]
[im 3/17]
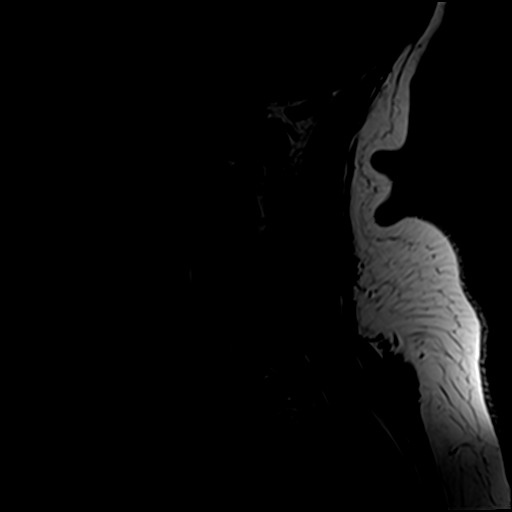
[im 10/17]
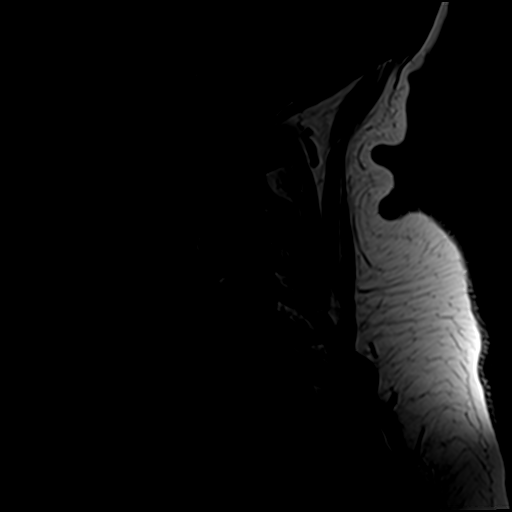
[im 14/17]
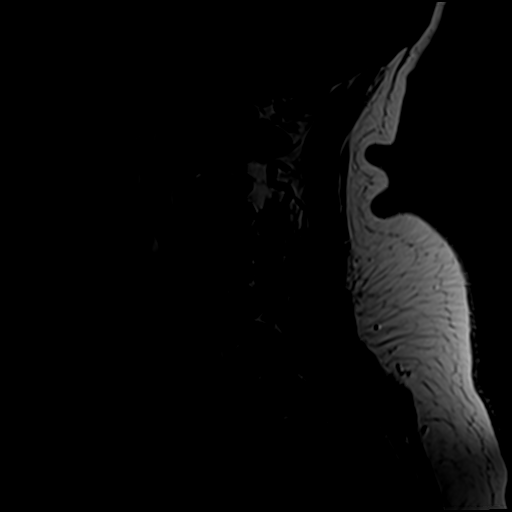

[Series 6: T2 · axial · 3.0mm · 0.39mm/px · z∈[+5,+83]mm · 6 of 25 slices shown (2 of 3)]
[im 1/25]
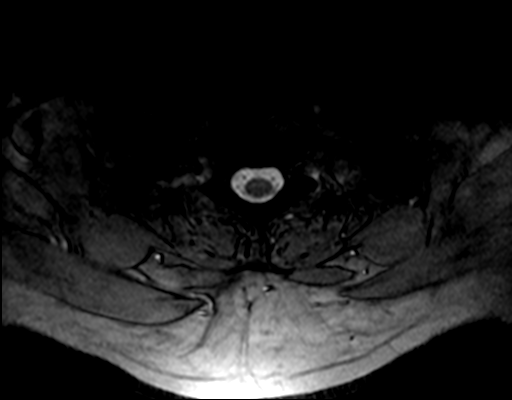
[im 5/25]
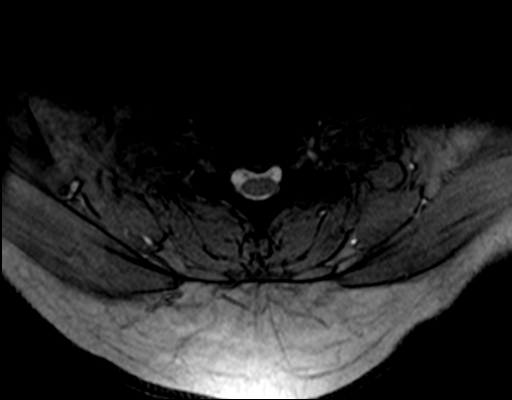
[im 7/25]
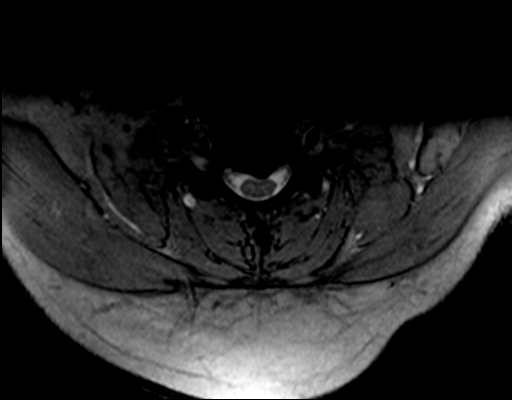
[im 11/25]
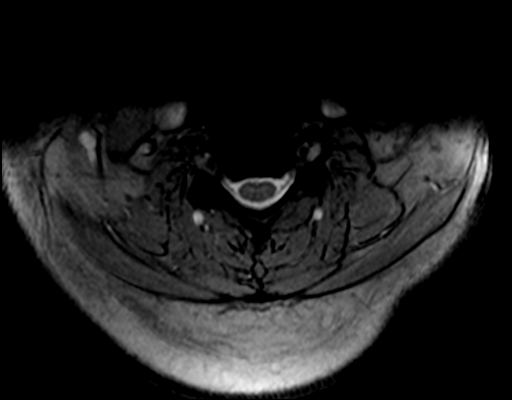
[im 14/25]
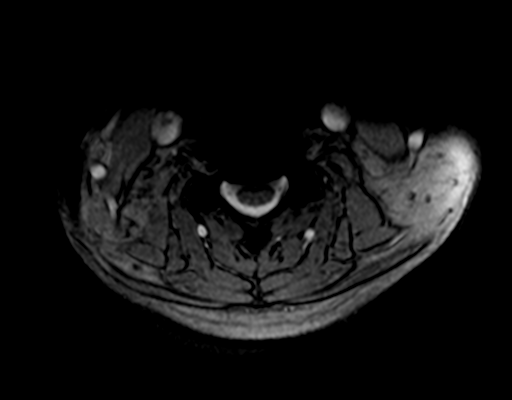
[im 22/25]
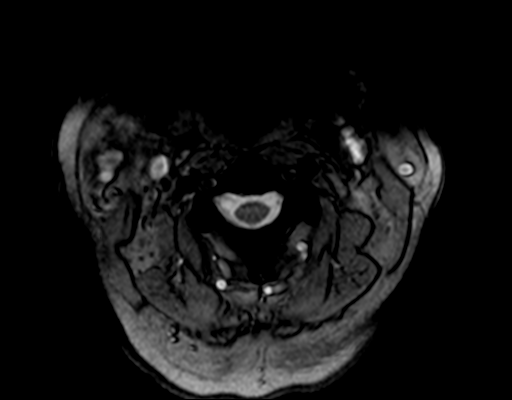

[Series 7: T2 · axial · 3.0mm · 0.39mm/px · z∈[+19,+82]mm · 3 of 25 slices shown (3 of 3)]
[im 5/25]
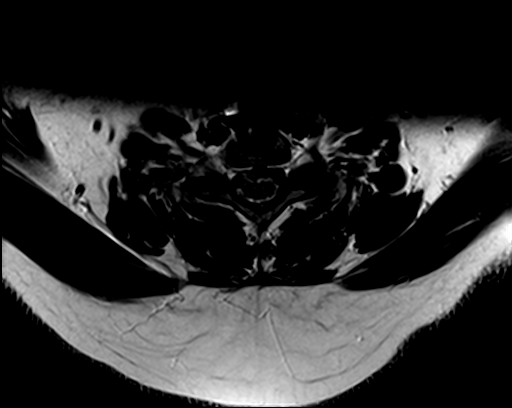
[im 14/25]
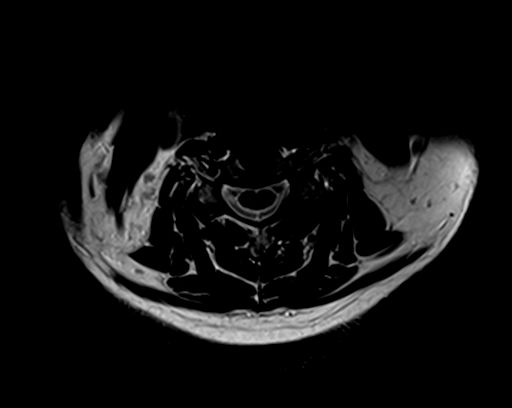
[im 22/25]
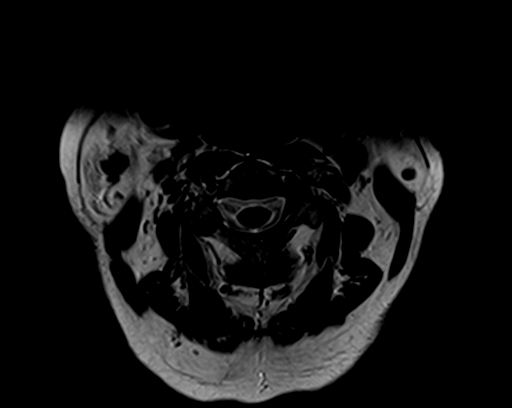

[20 of 48 positions shown; findings below may reference images not displayed]

FINDINGS: Alignment: Straightening of the normal cervical lordosis. No
listhesis.

Vertebrae: Susceptibility artifact from prior fusion at C5 through
C7. Vertebral body heights maintained without evidence for acute or
chronic fracture. Bone marrow signal intensity within normal limits.
No discrete or worrisome osseous lesions. Mild edema about the left
T2-3 facet due to facet arthritis.

Cord: Signal intensity within the cervical spinal cord is normal.

Posterior Fossa, vertebral arteries, paraspinal tissues: Visualized
brain and posterior fossa within normal limits. Craniocervical
junction normal. Paraspinous and prevertebral soft tissues are
normal. Normal intravascular flow voids present within the vertebral
arteries bilaterally.

Disc levels:

C2-C3: Ankylosis of the left C2-3 facet.  No stenosis.

C3-C4: Diffuse disc bulge, asymmetric to the right. Right-sided
uncovertebral hypertrophy. Mild flattening of the ventral CSF
without significant spinal stenosis. Mild right C4 foraminal
narrowing. Left neural foramen widely patent.

C4-C5: Mild uncovertebral hypertrophy on the right. Mild facet
hypertrophy. Resultant mild right C5 foraminal stenosis. No canal or
significant left foraminal narrowing.

C5-C6: Prior ACDF. No residual spinal stenosis. Uncovertebral
hypertrophy with resultant mild bilateral C6 foraminal narrowing.

C6-C7: Prior ACDF. Central/left paracentral osteophytic ridging
indents the left ventral thecal sac and results in mild spinal
stenosis (series 6, image 19). Minimal flattening of the left
ventral cord without cord signal changes. Left-sided uncovertebral
hypertrophy with resultant moderate left C7 foraminal narrowing.

C7-T1: Left greater than right facet hypertrophy. Central disc
protrusion with slight superior and inferior migration of disc
material. No significant stenosis.

Visualized upper thoracic spine within normal limits.
IMPRESSION: 1. Prior ACDF at C5 through C7. Left paracentral osseous ridging at
C6-7 with resultant mild spinal stenosis and moderate left C7
foraminal narrowing. Residual mild bilateral C6 foraminal narrowing
due to uncovertebral hypertrophy.
2. Disc bulge with uncovertebral hypertrophy at C3-4 and C4-5 with
resultant mild right C4 and C5 foraminal stenosis.
3. Central disc protrusion at C7-T1 without significant spinal
stenosis.

## 2019-10-23 DIAGNOSIS — M47812 Spondylosis without myelopathy or radiculopathy, cervical region: Secondary | ICD-10-CM | POA: Diagnosis not present

## 2019-10-23 DIAGNOSIS — Z79891 Long term (current) use of opiate analgesic: Secondary | ICD-10-CM | POA: Diagnosis not present

## 2019-10-23 DIAGNOSIS — M0579 Rheumatoid arthritis with rheumatoid factor of multiple sites without organ or systems involvement: Secondary | ICD-10-CM | POA: Diagnosis not present

## 2019-10-23 DIAGNOSIS — Z79899 Other long term (current) drug therapy: Secondary | ICD-10-CM | POA: Diagnosis not present

## 2019-10-23 DIAGNOSIS — A0101 Typhoid meningitis: Secondary | ICD-10-CM | POA: Diagnosis not present

## 2019-10-23 DIAGNOSIS — G894 Chronic pain syndrome: Secondary | ICD-10-CM | POA: Diagnosis not present

## 2019-10-23 DIAGNOSIS — M069 Rheumatoid arthritis, unspecified: Secondary | ICD-10-CM | POA: Diagnosis not present

## 2019-10-23 DIAGNOSIS — M05739 Rheumatoid arthritis with rheumatoid factor of unspecified wrist without organ or systems involvement: Secondary | ICD-10-CM | POA: Diagnosis not present

## 2019-10-23 DIAGNOSIS — G5600 Carpal tunnel syndrome, unspecified upper limb: Secondary | ICD-10-CM | POA: Diagnosis not present

## 2019-11-12 DIAGNOSIS — K760 Fatty (change of) liver, not elsewhere classified: Secondary | ICD-10-CM | POA: Diagnosis not present

## 2019-11-23 DIAGNOSIS — M069 Rheumatoid arthritis, unspecified: Secondary | ICD-10-CM | POA: Diagnosis not present

## 2019-11-23 DIAGNOSIS — M47812 Spondylosis without myelopathy or radiculopathy, cervical region: Secondary | ICD-10-CM | POA: Diagnosis not present

## 2019-11-23 DIAGNOSIS — G894 Chronic pain syndrome: Secondary | ICD-10-CM | POA: Diagnosis not present

## 2019-11-23 DIAGNOSIS — G5600 Carpal tunnel syndrome, unspecified upper limb: Secondary | ICD-10-CM | POA: Diagnosis not present

## 2019-12-22 DIAGNOSIS — G894 Chronic pain syndrome: Secondary | ICD-10-CM | POA: Diagnosis not present

## 2019-12-22 DIAGNOSIS — M47812 Spondylosis without myelopathy or radiculopathy, cervical region: Secondary | ICD-10-CM | POA: Diagnosis not present

## 2019-12-22 DIAGNOSIS — G5600 Carpal tunnel syndrome, unspecified upper limb: Secondary | ICD-10-CM | POA: Diagnosis not present

## 2019-12-22 DIAGNOSIS — M069 Rheumatoid arthritis, unspecified: Secondary | ICD-10-CM | POA: Diagnosis not present

## 2020-01-18 ENCOUNTER — Other Ambulatory Visit: Payer: Self-pay | Admitting: Gastroenterology

## 2020-01-18 DIAGNOSIS — R1012 Left upper quadrant pain: Secondary | ICD-10-CM

## 2020-01-19 DIAGNOSIS — M1990 Unspecified osteoarthritis, unspecified site: Secondary | ICD-10-CM | POA: Diagnosis not present

## 2020-01-19 DIAGNOSIS — M0579 Rheumatoid arthritis with rheumatoid factor of multiple sites without organ or systems involvement: Secondary | ICD-10-CM | POA: Diagnosis not present

## 2020-01-19 DIAGNOSIS — G894 Chronic pain syndrome: Secondary | ICD-10-CM | POA: Diagnosis not present

## 2020-01-19 DIAGNOSIS — M069 Rheumatoid arthritis, unspecified: Secondary | ICD-10-CM | POA: Diagnosis not present

## 2020-01-19 DIAGNOSIS — Z79899 Other long term (current) drug therapy: Secondary | ICD-10-CM | POA: Diagnosis not present

## 2020-01-19 DIAGNOSIS — G5600 Carpal tunnel syndrome, unspecified upper limb: Secondary | ICD-10-CM | POA: Diagnosis not present

## 2020-01-19 DIAGNOSIS — M47812 Spondylosis without myelopathy or radiculopathy, cervical region: Secondary | ICD-10-CM | POA: Diagnosis not present

## 2020-01-19 DIAGNOSIS — L509 Urticaria, unspecified: Secondary | ICD-10-CM | POA: Diagnosis not present

## 2020-01-19 DIAGNOSIS — M79673 Pain in unspecified foot: Secondary | ICD-10-CM | POA: Diagnosis not present

## 2020-01-19 DIAGNOSIS — M79643 Pain in unspecified hand: Secondary | ICD-10-CM | POA: Diagnosis not present

## 2020-01-19 DIAGNOSIS — R69 Illness, unspecified: Secondary | ICD-10-CM | POA: Diagnosis not present

## 2020-01-19 DIAGNOSIS — R5383 Other fatigue: Secondary | ICD-10-CM | POA: Diagnosis not present

## 2020-01-20 DIAGNOSIS — R69 Illness, unspecified: Secondary | ICD-10-CM | POA: Diagnosis not present

## 2020-01-20 DIAGNOSIS — F419 Anxiety disorder, unspecified: Secondary | ICD-10-CM | POA: Diagnosis not present

## 2020-01-26 ENCOUNTER — Other Ambulatory Visit: Payer: Self-pay | Admitting: Internal Medicine

## 2020-01-26 DIAGNOSIS — Z1231 Encounter for screening mammogram for malignant neoplasm of breast: Secondary | ICD-10-CM

## 2020-01-29 ENCOUNTER — Other Ambulatory Visit: Payer: Self-pay

## 2020-01-29 ENCOUNTER — Ambulatory Visit
Admission: RE | Admit: 2020-01-29 | Discharge: 2020-01-29 | Disposition: A | Discharge status: Home / Self Care | Payer: Medicare HMO | Source: Ambulatory Visit | Attending: Internal Medicine | Admitting: Internal Medicine

## 2020-01-29 DIAGNOSIS — Z1231 Encounter for screening mammogram for malignant neoplasm of breast: Secondary | ICD-10-CM | POA: Diagnosis not present

## 2020-02-16 DIAGNOSIS — G5600 Carpal tunnel syndrome, unspecified upper limb: Secondary | ICD-10-CM | POA: Diagnosis not present

## 2020-02-16 DIAGNOSIS — G894 Chronic pain syndrome: Secondary | ICD-10-CM | POA: Diagnosis not present

## 2020-02-16 DIAGNOSIS — Z79899 Other long term (current) drug therapy: Secondary | ICD-10-CM | POA: Diagnosis not present

## 2020-02-16 DIAGNOSIS — E782 Mixed hyperlipidemia: Secondary | ICD-10-CM | POA: Diagnosis not present

## 2020-02-16 DIAGNOSIS — M47812 Spondylosis without myelopathy or radiculopathy, cervical region: Secondary | ICD-10-CM | POA: Diagnosis not present

## 2020-02-16 DIAGNOSIS — M069 Rheumatoid arthritis, unspecified: Secondary | ICD-10-CM | POA: Diagnosis not present

## 2020-02-16 DIAGNOSIS — Z79891 Long term (current) use of opiate analgesic: Secondary | ICD-10-CM | POA: Diagnosis not present

## 2020-02-16 DIAGNOSIS — R7309 Other abnormal glucose: Secondary | ICD-10-CM | POA: Diagnosis not present

## 2020-02-17 DIAGNOSIS — F419 Anxiety disorder, unspecified: Secondary | ICD-10-CM | POA: Diagnosis not present

## 2020-02-17 DIAGNOSIS — N959 Unspecified menopausal and perimenopausal disorder: Secondary | ICD-10-CM | POA: Diagnosis not present

## 2020-02-17 DIAGNOSIS — R69 Illness, unspecified: Secondary | ICD-10-CM | POA: Diagnosis not present

## 2020-02-17 DIAGNOSIS — R6882 Decreased libido: Secondary | ICD-10-CM | POA: Diagnosis not present

## 2020-03-03 ENCOUNTER — Ambulatory Visit: Payer: Medicare HMO | Attending: Internal Medicine

## 2020-03-03 DIAGNOSIS — Z23 Encounter for immunization: Secondary | ICD-10-CM

## 2020-03-03 NOTE — Progress Notes (Signed)
   Covid-19 Vaccination Clinic  Name:  Molly Rangel    MRN: [id number] DOB: 1966/11/10  03/03/2020  Ms. Molly Rangel was observed post Covid-19 immunization for 15 minutes without incident. She was provided with Vaccine Information Sheet and instruction to access the V-Safe system.   Ms. Molly Rangel was instructed to call 911 with any severe reactions post vaccine: Marland Kitchen Difficulty breathing  . Swelling of face and throat  . A fast heartbeat  . A bad rash all over body  . Dizziness and weakness   Immunizations Administered    Name Date Dose VIS Date Route   Pfizer COVID-19 Vaccine 03/03/2020 11:52 AM 0.3 mL 01/06/2019 Intramuscular   Manufacturer: [employer], [employer]   Lot: [id number]   NDC: [address]-2

## 2020-03-15 DIAGNOSIS — G5600 Carpal tunnel syndrome, unspecified upper limb: Secondary | ICD-10-CM | POA: Diagnosis not present

## 2020-03-15 DIAGNOSIS — G894 Chronic pain syndrome: Secondary | ICD-10-CM | POA: Diagnosis not present

## 2020-03-15 DIAGNOSIS — M47812 Spondylosis without myelopathy or radiculopathy, cervical region: Secondary | ICD-10-CM | POA: Diagnosis not present

## 2020-03-15 DIAGNOSIS — M069 Rheumatoid arthritis, unspecified: Secondary | ICD-10-CM | POA: Diagnosis not present

## 2020-03-28 ENCOUNTER — Ambulatory Visit: Payer: Medicare HMO | Attending: Internal Medicine

## 2020-03-28 DIAGNOSIS — Z23 Encounter for immunization: Secondary | ICD-10-CM

## 2020-03-28 NOTE — Progress Notes (Signed)
   Covid-19 Vaccination Clinic  Name:  Molly Rangel    MRN: [id number] DOB: 12/06/1965  03/28/2020  Ms. Molly Rangel was observed post Covid-19 immunization for 15 minutes without incident. She was provided with Vaccine Information Sheet and instruction to access the V-Safe system.   Ms. Molly Rangel was instructed to call 911 with any severe reactions post vaccine: Marland Kitchen Difficulty breathing  . Swelling of face and throat  . A fast heartbeat  . A bad rash all over body  . Dizziness and weakness   Immunizations Administered    Name Date Dose VIS Date Route   Pfizer COVID-19 Vaccine 03/28/2020 11:53 AM 0.3 mL 01/06/2019 Intramuscular   Manufacturer: [employer], [employer]   Lot: [id number]   NDC: [address]-1

## 2020-04-16 DIAGNOSIS — Z03818 Encounter for observation for suspected exposure to other biological agents ruled out: Secondary | ICD-10-CM | POA: Diagnosis not present

## 2020-04-16 DIAGNOSIS — Z20822 Contact with and (suspected) exposure to covid-19: Secondary | ICD-10-CM | POA: Diagnosis not present

## 2020-04-20 DIAGNOSIS — G5600 Carpal tunnel syndrome, unspecified upper limb: Secondary | ICD-10-CM | POA: Diagnosis not present

## 2020-04-20 DIAGNOSIS — M069 Rheumatoid arthritis, unspecified: Secondary | ICD-10-CM | POA: Diagnosis not present

## 2020-04-20 DIAGNOSIS — M47812 Spondylosis without myelopathy or radiculopathy, cervical region: Secondary | ICD-10-CM | POA: Diagnosis not present

## 2020-04-20 DIAGNOSIS — Z79899 Other long term (current) drug therapy: Secondary | ICD-10-CM | POA: Diagnosis not present

## 2020-04-20 DIAGNOSIS — Z79891 Long term (current) use of opiate analgesic: Secondary | ICD-10-CM | POA: Diagnosis not present

## 2020-04-20 DIAGNOSIS — G894 Chronic pain syndrome: Secondary | ICD-10-CM | POA: Diagnosis not present

## 2020-05-04 DIAGNOSIS — B370 Candidal stomatitis: Secondary | ICD-10-CM | POA: Diagnosis not present

## 2020-05-04 DIAGNOSIS — I10 Essential (primary) hypertension: Secondary | ICD-10-CM | POA: Diagnosis not present

## 2020-05-18 DIAGNOSIS — M0579 Rheumatoid arthritis with rheumatoid factor of multiple sites without organ or systems involvement: Secondary | ICD-10-CM | POA: Diagnosis not present

## 2020-05-18 DIAGNOSIS — M069 Rheumatoid arthritis, unspecified: Secondary | ICD-10-CM | POA: Diagnosis not present

## 2020-05-18 DIAGNOSIS — G894 Chronic pain syndrome: Secondary | ICD-10-CM | POA: Diagnosis not present

## 2020-05-18 DIAGNOSIS — M47812 Spondylosis without myelopathy or radiculopathy, cervical region: Secondary | ICD-10-CM | POA: Diagnosis not present

## 2020-05-18 DIAGNOSIS — E782 Mixed hyperlipidemia: Secondary | ICD-10-CM | POA: Diagnosis not present

## 2020-05-18 DIAGNOSIS — G5600 Carpal tunnel syndrome, unspecified upper limb: Secondary | ICD-10-CM | POA: Diagnosis not present

## 2020-05-18 DIAGNOSIS — R7309 Other abnormal glucose: Secondary | ICD-10-CM | POA: Diagnosis not present

## 2020-05-25 DIAGNOSIS — R69 Illness, unspecified: Secondary | ICD-10-CM | POA: Diagnosis not present

## 2020-05-25 DIAGNOSIS — J3489 Other specified disorders of nose and nasal sinuses: Secondary | ICD-10-CM | POA: Diagnosis not present

## 2020-05-25 DIAGNOSIS — J309 Allergic rhinitis, unspecified: Secondary | ICD-10-CM | POA: Diagnosis not present

## 2020-05-25 DIAGNOSIS — E782 Mixed hyperlipidemia: Secondary | ICD-10-CM | POA: Diagnosis not present

## 2020-05-25 DIAGNOSIS — R7309 Other abnormal glucose: Secondary | ICD-10-CM | POA: Diagnosis not present

## 2020-05-25 DIAGNOSIS — J320 Chronic maxillary sinusitis: Secondary | ICD-10-CM | POA: Diagnosis not present

## 2020-05-25 DIAGNOSIS — J0100 Acute maxillary sinusitis, unspecified: Secondary | ICD-10-CM | POA: Diagnosis not present

## 2020-05-25 DIAGNOSIS — K760 Fatty (change of) liver, not elsewhere classified: Secondary | ICD-10-CM | POA: Diagnosis not present

## 2020-06-15 DIAGNOSIS — G5600 Carpal tunnel syndrome, unspecified upper limb: Secondary | ICD-10-CM | POA: Diagnosis not present

## 2020-06-15 DIAGNOSIS — M47812 Spondylosis without myelopathy or radiculopathy, cervical region: Secondary | ICD-10-CM | POA: Diagnosis not present

## 2020-06-15 DIAGNOSIS — M069 Rheumatoid arthritis, unspecified: Secondary | ICD-10-CM | POA: Diagnosis not present

## 2020-06-15 DIAGNOSIS — G894 Chronic pain syndrome: Secondary | ICD-10-CM | POA: Diagnosis not present

## 2020-07-15 DIAGNOSIS — G5600 Carpal tunnel syndrome, unspecified upper limb: Secondary | ICD-10-CM | POA: Diagnosis not present

## 2020-07-15 DIAGNOSIS — Z79899 Other long term (current) drug therapy: Secondary | ICD-10-CM | POA: Diagnosis not present

## 2020-07-15 DIAGNOSIS — M47812 Spondylosis without myelopathy or radiculopathy, cervical region: Secondary | ICD-10-CM | POA: Diagnosis not present

## 2020-07-15 DIAGNOSIS — G894 Chronic pain syndrome: Secondary | ICD-10-CM | POA: Diagnosis not present

## 2020-07-15 DIAGNOSIS — M069 Rheumatoid arthritis, unspecified: Secondary | ICD-10-CM | POA: Diagnosis not present

## 2020-07-15 DIAGNOSIS — Z79891 Long term (current) use of opiate analgesic: Secondary | ICD-10-CM | POA: Diagnosis not present

## 2020-08-12 ENCOUNTER — Other Ambulatory Visit: Payer: Self-pay | Admitting: Physician Assistant

## 2020-08-12 DIAGNOSIS — M79642 Pain in left hand: Secondary | ICD-10-CM

## 2020-08-12 DIAGNOSIS — M79641 Pain in right hand: Secondary | ICD-10-CM

## 2020-08-16 DIAGNOSIS — M069 Rheumatoid arthritis, unspecified: Secondary | ICD-10-CM | POA: Diagnosis not present

## 2020-08-16 DIAGNOSIS — G5600 Carpal tunnel syndrome, unspecified upper limb: Secondary | ICD-10-CM | POA: Diagnosis not present

## 2020-08-16 DIAGNOSIS — M47812 Spondylosis without myelopathy or radiculopathy, cervical region: Secondary | ICD-10-CM | POA: Diagnosis not present

## 2020-08-16 DIAGNOSIS — G894 Chronic pain syndrome: Secondary | ICD-10-CM | POA: Diagnosis not present

## 2020-09-06 ENCOUNTER — Other Ambulatory Visit: Payer: Self-pay

## 2020-09-06 ENCOUNTER — Ambulatory Visit (INDEPENDENT_AMBULATORY_CARE_PROVIDER_SITE_OTHER): Payer: Medicare HMO

## 2020-09-06 DIAGNOSIS — M25531 Pain in right wrist: Secondary | ICD-10-CM | POA: Diagnosis not present

## 2020-09-06 DIAGNOSIS — M19041 Primary osteoarthritis, right hand: Secondary | ICD-10-CM | POA: Diagnosis not present

## 2020-09-06 DIAGNOSIS — M79642 Pain in left hand: Secondary | ICD-10-CM

## 2020-09-06 DIAGNOSIS — M79641 Pain in right hand: Secondary | ICD-10-CM | POA: Diagnosis not present

## 2020-09-06 DIAGNOSIS — M25532 Pain in left wrist: Secondary | ICD-10-CM | POA: Diagnosis not present

## 2020-09-06 DIAGNOSIS — M7989 Other specified soft tissue disorders: Secondary | ICD-10-CM | POA: Diagnosis not present

## 2020-09-13 DIAGNOSIS — M069 Rheumatoid arthritis, unspecified: Secondary | ICD-10-CM | POA: Diagnosis not present

## 2020-09-13 DIAGNOSIS — M47812 Spondylosis without myelopathy or radiculopathy, cervical region: Secondary | ICD-10-CM | POA: Diagnosis not present

## 2020-09-13 DIAGNOSIS — G5600 Carpal tunnel syndrome, unspecified upper limb: Secondary | ICD-10-CM | POA: Diagnosis not present

## 2020-09-13 DIAGNOSIS — G894 Chronic pain syndrome: Secondary | ICD-10-CM | POA: Diagnosis not present

## 2020-09-30 DIAGNOSIS — R7309 Other abnormal glucose: Secondary | ICD-10-CM | POA: Diagnosis not present

## 2020-09-30 DIAGNOSIS — K760 Fatty (change of) liver, not elsewhere classified: Secondary | ICD-10-CM | POA: Diagnosis not present

## 2020-09-30 DIAGNOSIS — E782 Mixed hyperlipidemia: Secondary | ICD-10-CM | POA: Diagnosis not present

## 2020-10-04 DIAGNOSIS — M0579 Rheumatoid arthritis with rheumatoid factor of multiple sites without organ or systems involvement: Secondary | ICD-10-CM | POA: Diagnosis not present

## 2020-10-04 DIAGNOSIS — R69 Illness, unspecified: Secondary | ICD-10-CM | POA: Diagnosis not present

## 2020-10-04 DIAGNOSIS — M79643 Pain in unspecified hand: Secondary | ICD-10-CM | POA: Diagnosis not present

## 2020-10-04 DIAGNOSIS — M79673 Pain in unspecified foot: Secondary | ICD-10-CM | POA: Diagnosis not present

## 2020-10-04 DIAGNOSIS — L509 Urticaria, unspecified: Secondary | ICD-10-CM | POA: Diagnosis not present

## 2020-10-04 DIAGNOSIS — M1990 Unspecified osteoarthritis, unspecified site: Secondary | ICD-10-CM | POA: Diagnosis not present

## 2020-10-04 DIAGNOSIS — R5383 Other fatigue: Secondary | ICD-10-CM | POA: Diagnosis not present

## 2020-10-04 DIAGNOSIS — Z79899 Other long term (current) drug therapy: Secondary | ICD-10-CM | POA: Diagnosis not present

## 2020-10-05 DIAGNOSIS — E782 Mixed hyperlipidemia: Secondary | ICD-10-CM | POA: Diagnosis not present

## 2020-10-05 DIAGNOSIS — R5383 Other fatigue: Secondary | ICD-10-CM | POA: Diagnosis not present

## 2020-10-05 DIAGNOSIS — R7309 Other abnormal glucose: Secondary | ICD-10-CM | POA: Diagnosis not present

## 2020-10-05 DIAGNOSIS — L509 Urticaria, unspecified: Secondary | ICD-10-CM | POA: Diagnosis not present

## 2020-10-05 DIAGNOSIS — Z5181 Encounter for therapeutic drug level monitoring: Secondary | ICD-10-CM | POA: Diagnosis not present

## 2020-10-05 DIAGNOSIS — M0579 Rheumatoid arthritis with rheumatoid factor of multiple sites without organ or systems involvement: Secondary | ICD-10-CM | POA: Diagnosis not present

## 2020-10-05 DIAGNOSIS — K219 Gastro-esophageal reflux disease without esophagitis: Secondary | ICD-10-CM | POA: Diagnosis not present

## 2020-10-05 DIAGNOSIS — K760 Fatty (change of) liver, not elsewhere classified: Secondary | ICD-10-CM | POA: Diagnosis not present

## 2020-10-05 DIAGNOSIS — Z0000 Encounter for general adult medical examination without abnormal findings: Secondary | ICD-10-CM | POA: Diagnosis not present

## 2020-10-05 DIAGNOSIS — R69 Illness, unspecified: Secondary | ICD-10-CM | POA: Diagnosis not present

## 2020-10-11 DIAGNOSIS — G894 Chronic pain syndrome: Secondary | ICD-10-CM | POA: Diagnosis not present

## 2020-10-11 DIAGNOSIS — M47812 Spondylosis without myelopathy or radiculopathy, cervical region: Secondary | ICD-10-CM | POA: Diagnosis not present

## 2020-10-11 DIAGNOSIS — Z79891 Long term (current) use of opiate analgesic: Secondary | ICD-10-CM | POA: Diagnosis not present

## 2020-10-11 DIAGNOSIS — Z79899 Other long term (current) drug therapy: Secondary | ICD-10-CM | POA: Diagnosis not present

## 2020-10-11 DIAGNOSIS — G5600 Carpal tunnel syndrome, unspecified upper limb: Secondary | ICD-10-CM | POA: Diagnosis not present

## 2020-10-11 DIAGNOSIS — M069 Rheumatoid arthritis, unspecified: Secondary | ICD-10-CM | POA: Diagnosis not present

## 2020-11-02 DIAGNOSIS — M069 Rheumatoid arthritis, unspecified: Secondary | ICD-10-CM | POA: Diagnosis not present

## 2020-11-02 DIAGNOSIS — R69 Illness, unspecified: Secondary | ICD-10-CM | POA: Diagnosis not present

## 2020-11-08 DIAGNOSIS — G5600 Carpal tunnel syndrome, unspecified upper limb: Secondary | ICD-10-CM | POA: Diagnosis not present

## 2020-11-08 DIAGNOSIS — G894 Chronic pain syndrome: Secondary | ICD-10-CM | POA: Diagnosis not present

## 2020-11-08 DIAGNOSIS — M47812 Spondylosis without myelopathy or radiculopathy, cervical region: Secondary | ICD-10-CM | POA: Diagnosis not present

## 2020-11-08 DIAGNOSIS — M069 Rheumatoid arthritis, unspecified: Secondary | ICD-10-CM | POA: Diagnosis not present

## 2020-11-21 ENCOUNTER — Other Ambulatory Visit: Payer: Self-pay | Admitting: Gastroenterology

## 2020-11-21 DIAGNOSIS — R1012 Left upper quadrant pain: Secondary | ICD-10-CM

## 2020-11-21 DIAGNOSIS — Z23 Encounter for immunization: Secondary | ICD-10-CM | POA: Diagnosis not present

## 2020-12-08 DIAGNOSIS — G894 Chronic pain syndrome: Secondary | ICD-10-CM | POA: Diagnosis not present

## 2020-12-08 DIAGNOSIS — M069 Rheumatoid arthritis, unspecified: Secondary | ICD-10-CM | POA: Diagnosis not present

## 2020-12-08 DIAGNOSIS — M47812 Spondylosis without myelopathy or radiculopathy, cervical region: Secondary | ICD-10-CM | POA: Diagnosis not present

## 2020-12-08 DIAGNOSIS — G5600 Carpal tunnel syndrome, unspecified upper limb: Secondary | ICD-10-CM | POA: Diagnosis not present

## 2021-01-05 DIAGNOSIS — M47812 Spondylosis without myelopathy or radiculopathy, cervical region: Secondary | ICD-10-CM | POA: Diagnosis not present

## 2021-01-05 DIAGNOSIS — Z79899 Other long term (current) drug therapy: Secondary | ICD-10-CM | POA: Diagnosis not present

## 2021-01-05 DIAGNOSIS — Z79891 Long term (current) use of opiate analgesic: Secondary | ICD-10-CM | POA: Diagnosis not present

## 2021-01-05 DIAGNOSIS — M0579 Rheumatoid arthritis with rheumatoid factor of multiple sites without organ or systems involvement: Secondary | ICD-10-CM | POA: Diagnosis not present

## 2021-01-05 DIAGNOSIS — G5600 Carpal tunnel syndrome, unspecified upper limb: Secondary | ICD-10-CM | POA: Diagnosis not present

## 2021-01-05 DIAGNOSIS — M069 Rheumatoid arthritis, unspecified: Secondary | ICD-10-CM | POA: Diagnosis not present

## 2021-01-05 DIAGNOSIS — G894 Chronic pain syndrome: Secondary | ICD-10-CM | POA: Diagnosis not present

## 2021-01-11 DIAGNOSIS — R5383 Other fatigue: Secondary | ICD-10-CM | POA: Diagnosis not present

## 2021-01-11 DIAGNOSIS — K1120 Sialoadenitis, unspecified: Secondary | ICD-10-CM | POA: Diagnosis not present

## 2021-01-18 DIAGNOSIS — K113 Abscess of salivary gland: Secondary | ICD-10-CM | POA: Diagnosis not present

## 2021-01-24 DIAGNOSIS — K113 Abscess of salivary gland: Secondary | ICD-10-CM | POA: Diagnosis not present

## 2021-01-27 DIAGNOSIS — K113 Abscess of salivary gland: Secondary | ICD-10-CM | POA: Diagnosis not present

## 2021-02-02 DIAGNOSIS — K113 Abscess of salivary gland: Secondary | ICD-10-CM | POA: Diagnosis not present

## 2021-02-08 DIAGNOSIS — M069 Rheumatoid arthritis, unspecified: Secondary | ICD-10-CM | POA: Diagnosis not present

## 2021-02-08 DIAGNOSIS — Z79891 Long term (current) use of opiate analgesic: Secondary | ICD-10-CM | POA: Diagnosis not present

## 2021-02-08 DIAGNOSIS — G894 Chronic pain syndrome: Secondary | ICD-10-CM | POA: Diagnosis not present

## 2021-02-08 DIAGNOSIS — Z79899 Other long term (current) drug therapy: Secondary | ICD-10-CM | POA: Diagnosis not present

## 2021-02-08 DIAGNOSIS — M47812 Spondylosis without myelopathy or radiculopathy, cervical region: Secondary | ICD-10-CM | POA: Diagnosis not present

## 2021-02-08 DIAGNOSIS — M2550 Pain in unspecified joint: Secondary | ICD-10-CM | POA: Diagnosis not present

## 2021-02-17 DIAGNOSIS — K113 Abscess of salivary gland: Secondary | ICD-10-CM | POA: Diagnosis not present

## 2021-03-09 ENCOUNTER — Other Ambulatory Visit: Payer: Self-pay | Admitting: Internal Medicine

## 2021-03-09 DIAGNOSIS — Z1231 Encounter for screening mammogram for malignant neoplasm of breast: Secondary | ICD-10-CM

## 2021-03-10 DIAGNOSIS — G894 Chronic pain syndrome: Secondary | ICD-10-CM | POA: Diagnosis not present

## 2021-03-10 DIAGNOSIS — M47812 Spondylosis without myelopathy or radiculopathy, cervical region: Secondary | ICD-10-CM | POA: Diagnosis not present

## 2021-03-10 DIAGNOSIS — M2550 Pain in unspecified joint: Secondary | ICD-10-CM | POA: Diagnosis not present

## 2021-03-10 DIAGNOSIS — M069 Rheumatoid arthritis, unspecified: Secondary | ICD-10-CM | POA: Diagnosis not present

## 2021-03-14 ENCOUNTER — Other Ambulatory Visit: Payer: Self-pay | Admitting: Gastroenterology

## 2021-03-14 DIAGNOSIS — R1012 Left upper quadrant pain: Secondary | ICD-10-CM

## 2021-03-15 ENCOUNTER — Ambulatory Visit (INDEPENDENT_AMBULATORY_CARE_PROVIDER_SITE_OTHER): Payer: Medicare HMO

## 2021-03-15 ENCOUNTER — Ambulatory Visit: Payer: Medicare HMO

## 2021-03-15 ENCOUNTER — Other Ambulatory Visit: Payer: Self-pay

## 2021-03-15 DIAGNOSIS — Z1231 Encounter for screening mammogram for malignant neoplasm of breast: Secondary | ICD-10-CM | POA: Diagnosis not present

## 2021-03-15 DIAGNOSIS — K113 Abscess of salivary gland: Secondary | ICD-10-CM | POA: Diagnosis not present

## 2021-03-22 DIAGNOSIS — H8111 Benign paroxysmal vertigo, right ear: Secondary | ICD-10-CM | POA: Diagnosis not present

## 2021-03-22 DIAGNOSIS — H818X1 Other disorders of vestibular function, right ear: Secondary | ICD-10-CM | POA: Diagnosis not present

## 2021-04-03 DIAGNOSIS — H25041 Posterior subcapsular polar age-related cataract, right eye: Secondary | ICD-10-CM | POA: Diagnosis not present

## 2021-04-03 DIAGNOSIS — H35013 Changes in retinal vascular appearance, bilateral: Secondary | ICD-10-CM | POA: Diagnosis not present

## 2021-04-03 DIAGNOSIS — H25013 Cortical age-related cataract, bilateral: Secondary | ICD-10-CM | POA: Diagnosis not present

## 2021-04-03 DIAGNOSIS — H524 Presbyopia: Secondary | ICD-10-CM | POA: Diagnosis not present

## 2021-04-03 DIAGNOSIS — H2513 Age-related nuclear cataract, bilateral: Secondary | ICD-10-CM | POA: Diagnosis not present

## 2021-04-04 DIAGNOSIS — H8111 Benign paroxysmal vertigo, right ear: Secondary | ICD-10-CM | POA: Diagnosis not present

## 2021-04-05 DIAGNOSIS — M542 Cervicalgia: Secondary | ICD-10-CM | POA: Diagnosis not present

## 2021-04-05 DIAGNOSIS — M47812 Spondylosis without myelopathy or radiculopathy, cervical region: Secondary | ICD-10-CM | POA: Diagnosis not present

## 2021-04-05 DIAGNOSIS — Z79899 Other long term (current) drug therapy: Secondary | ICD-10-CM | POA: Diagnosis not present

## 2021-04-05 DIAGNOSIS — G894 Chronic pain syndrome: Secondary | ICD-10-CM | POA: Diagnosis not present

## 2021-04-05 DIAGNOSIS — M069 Rheumatoid arthritis, unspecified: Secondary | ICD-10-CM | POA: Diagnosis not present

## 2021-04-05 DIAGNOSIS — Z79891 Long term (current) use of opiate analgesic: Secondary | ICD-10-CM | POA: Diagnosis not present

## 2021-04-14 DIAGNOSIS — L509 Urticaria, unspecified: Secondary | ICD-10-CM | POA: Diagnosis not present

## 2021-04-14 DIAGNOSIS — M7989 Other specified soft tissue disorders: Secondary | ICD-10-CM | POA: Diagnosis not present

## 2021-04-14 DIAGNOSIS — M79643 Pain in unspecified hand: Secondary | ICD-10-CM | POA: Diagnosis not present

## 2021-04-14 DIAGNOSIS — M1990 Unspecified osteoarthritis, unspecified site: Secondary | ICD-10-CM | POA: Diagnosis not present

## 2021-04-14 DIAGNOSIS — M542 Cervicalgia: Secondary | ICD-10-CM | POA: Diagnosis not present

## 2021-04-14 DIAGNOSIS — Z79899 Other long term (current) drug therapy: Secondary | ICD-10-CM | POA: Diagnosis not present

## 2021-04-14 DIAGNOSIS — M0579 Rheumatoid arthritis with rheumatoid factor of multiple sites without organ or systems involvement: Secondary | ICD-10-CM | POA: Diagnosis not present

## 2021-04-20 DIAGNOSIS — M0579 Rheumatoid arthritis with rheumatoid factor of multiple sites without organ or systems involvement: Secondary | ICD-10-CM | POA: Diagnosis not present

## 2021-04-20 DIAGNOSIS — Z981 Arthrodesis status: Secondary | ICD-10-CM | POA: Diagnosis not present

## 2021-04-20 DIAGNOSIS — M542 Cervicalgia: Secondary | ICD-10-CM | POA: Diagnosis not present

## 2021-04-20 DIAGNOSIS — Z79899 Other long term (current) drug therapy: Secondary | ICD-10-CM | POA: Diagnosis not present

## 2021-04-20 DIAGNOSIS — M0609 Rheumatoid arthritis without rheumatoid factor, multiple sites: Secondary | ICD-10-CM | POA: Diagnosis not present

## 2021-04-20 DIAGNOSIS — M79641 Pain in right hand: Secondary | ICD-10-CM | POA: Diagnosis not present

## 2021-04-20 DIAGNOSIS — M7989 Other specified soft tissue disorders: Secondary | ICD-10-CM | POA: Diagnosis not present

## 2021-04-20 DIAGNOSIS — M5033 Other cervical disc degeneration, cervicothoracic region: Secondary | ICD-10-CM | POA: Diagnosis not present

## 2021-04-20 DIAGNOSIS — R7309 Other abnormal glucose: Secondary | ICD-10-CM | POA: Diagnosis not present

## 2021-04-20 DIAGNOSIS — M19042 Primary osteoarthritis, left hand: Secondary | ICD-10-CM | POA: Diagnosis not present

## 2021-04-20 DIAGNOSIS — M79642 Pain in left hand: Secondary | ICD-10-CM | POA: Diagnosis not present

## 2021-04-20 DIAGNOSIS — M19041 Primary osteoarthritis, right hand: Secondary | ICD-10-CM | POA: Diagnosis not present

## 2021-04-20 DIAGNOSIS — M79671 Pain in right foot: Secondary | ICD-10-CM | POA: Diagnosis not present

## 2021-04-20 DIAGNOSIS — E782 Mixed hyperlipidemia: Secondary | ICD-10-CM | POA: Diagnosis not present

## 2021-04-20 DIAGNOSIS — M47812 Spondylosis without myelopathy or radiculopathy, cervical region: Secondary | ICD-10-CM | POA: Diagnosis not present

## 2021-04-20 DIAGNOSIS — M79672 Pain in left foot: Secondary | ICD-10-CM | POA: Diagnosis not present

## 2021-05-03 DIAGNOSIS — G894 Chronic pain syndrome: Secondary | ICD-10-CM | POA: Diagnosis not present

## 2021-05-03 DIAGNOSIS — M069 Rheumatoid arthritis, unspecified: Secondary | ICD-10-CM | POA: Diagnosis not present

## 2021-05-03 DIAGNOSIS — M47812 Spondylosis without myelopathy or radiculopathy, cervical region: Secondary | ICD-10-CM | POA: Diagnosis not present

## 2021-05-03 DIAGNOSIS — M2550 Pain in unspecified joint: Secondary | ICD-10-CM | POA: Diagnosis not present

## 2021-05-12 DIAGNOSIS — R7309 Other abnormal glucose: Secondary | ICD-10-CM | POA: Diagnosis not present

## 2021-05-12 DIAGNOSIS — K760 Fatty (change of) liver, not elsewhere classified: Secondary | ICD-10-CM | POA: Diagnosis not present

## 2021-05-12 DIAGNOSIS — M0579 Rheumatoid arthritis with rheumatoid factor of multiple sites without organ or systems involvement: Secondary | ICD-10-CM | POA: Diagnosis not present

## 2021-05-12 DIAGNOSIS — E782 Mixed hyperlipidemia: Secondary | ICD-10-CM | POA: Diagnosis not present

## 2021-05-25 ENCOUNTER — Other Ambulatory Visit: Payer: Self-pay | Admitting: Physician Assistant

## 2021-05-25 ENCOUNTER — Ambulatory Visit
Admission: RE | Admit: 2021-05-25 | Discharge: 2021-05-25 | Disposition: A | Discharge status: Home / Self Care | Payer: Medicare HMO | Source: Ambulatory Visit | Attending: Physician Assistant | Admitting: Physician Assistant

## 2021-05-25 DIAGNOSIS — M25561 Pain in right knee: Secondary | ICD-10-CM

## 2021-05-25 DIAGNOSIS — M1712 Unilateral primary osteoarthritis, left knee: Secondary | ICD-10-CM | POA: Diagnosis not present

## 2021-05-25 DIAGNOSIS — M1711 Unilateral primary osteoarthritis, right knee: Secondary | ICD-10-CM | POA: Diagnosis not present

## 2021-05-25 DIAGNOSIS — M25562 Pain in left knee: Secondary | ICD-10-CM

## 2021-05-29 DIAGNOSIS — M542 Cervicalgia: Secondary | ICD-10-CM | POA: Diagnosis not present

## 2021-05-29 DIAGNOSIS — Z981 Arthrodesis status: Secondary | ICD-10-CM | POA: Diagnosis not present

## 2021-05-30 DIAGNOSIS — M47812 Spondylosis without myelopathy or radiculopathy, cervical region: Secondary | ICD-10-CM | POA: Diagnosis not present

## 2021-05-30 DIAGNOSIS — M2550 Pain in unspecified joint: Secondary | ICD-10-CM | POA: Diagnosis not present

## 2021-05-30 DIAGNOSIS — G894 Chronic pain syndrome: Secondary | ICD-10-CM | POA: Diagnosis not present

## 2021-05-30 DIAGNOSIS — M25569 Pain in unspecified knee: Secondary | ICD-10-CM | POA: Diagnosis not present

## 2021-07-06 DIAGNOSIS — M47812 Spondylosis without myelopathy or radiculopathy, cervical region: Secondary | ICD-10-CM | POA: Diagnosis not present

## 2021-07-06 DIAGNOSIS — M069 Rheumatoid arthritis, unspecified: Secondary | ICD-10-CM | POA: Diagnosis not present

## 2021-07-06 DIAGNOSIS — M542 Cervicalgia: Secondary | ICD-10-CM | POA: Diagnosis not present

## 2021-07-06 DIAGNOSIS — G894 Chronic pain syndrome: Secondary | ICD-10-CM | POA: Diagnosis not present

## 2021-07-25 DIAGNOSIS — M542 Cervicalgia: Secondary | ICD-10-CM | POA: Diagnosis not present

## 2021-07-31 DIAGNOSIS — M545 Low back pain, unspecified: Secondary | ICD-10-CM | POA: Diagnosis not present

## 2021-08-03 DIAGNOSIS — M2550 Pain in unspecified joint: Secondary | ICD-10-CM | POA: Diagnosis not present

## 2021-08-03 DIAGNOSIS — G894 Chronic pain syndrome: Secondary | ICD-10-CM | POA: Diagnosis not present

## 2021-08-03 DIAGNOSIS — M069 Rheumatoid arthritis, unspecified: Secondary | ICD-10-CM | POA: Diagnosis not present

## 2021-08-03 DIAGNOSIS — M47812 Spondylosis without myelopathy or radiculopathy, cervical region: Secondary | ICD-10-CM | POA: Diagnosis not present

## 2021-08-17 DIAGNOSIS — J320 Chronic maxillary sinusitis: Secondary | ICD-10-CM | POA: Diagnosis not present

## 2021-08-17 DIAGNOSIS — K117 Disturbances of salivary secretion: Secondary | ICD-10-CM | POA: Diagnosis not present

## 2021-08-17 DIAGNOSIS — J341 Cyst and mucocele of nose and nasal sinus: Secondary | ICD-10-CM | POA: Diagnosis not present

## 2021-08-22 DIAGNOSIS — R110 Nausea: Secondary | ICD-10-CM | POA: Diagnosis not present

## 2021-08-22 DIAGNOSIS — N907 Vulvar cyst: Secondary | ICD-10-CM | POA: Diagnosis not present

## 2021-08-22 DIAGNOSIS — N959 Unspecified menopausal and perimenopausal disorder: Secondary | ICD-10-CM | POA: Diagnosis not present

## 2021-08-22 DIAGNOSIS — Z124 Encounter for screening for malignant neoplasm of cervix: Secondary | ICD-10-CM | POA: Diagnosis not present

## 2021-08-22 DIAGNOSIS — R1030 Lower abdominal pain, unspecified: Secondary | ICD-10-CM | POA: Diagnosis not present

## 2021-08-22 DIAGNOSIS — R69 Illness, unspecified: Secondary | ICD-10-CM | POA: Diagnosis not present

## 2021-09-04 DIAGNOSIS — Z79899 Other long term (current) drug therapy: Secondary | ICD-10-CM | POA: Diagnosis not present

## 2021-09-04 DIAGNOSIS — Z79891 Long term (current) use of opiate analgesic: Secondary | ICD-10-CM | POA: Diagnosis not present

## 2021-09-04 DIAGNOSIS — G894 Chronic pain syndrome: Secondary | ICD-10-CM | POA: Diagnosis not present

## 2021-09-04 DIAGNOSIS — M2550 Pain in unspecified joint: Secondary | ICD-10-CM | POA: Diagnosis not present

## 2021-09-04 DIAGNOSIS — M47812 Spondylosis without myelopathy or radiculopathy, cervical region: Secondary | ICD-10-CM | POA: Diagnosis not present

## 2021-09-04 DIAGNOSIS — M069 Rheumatoid arthritis, unspecified: Secondary | ICD-10-CM | POA: Diagnosis not present

## 2021-09-11 DIAGNOSIS — K121 Other forms of stomatitis: Secondary | ICD-10-CM | POA: Diagnosis not present

## 2021-09-11 DIAGNOSIS — M7989 Other specified soft tissue disorders: Secondary | ICD-10-CM | POA: Diagnosis not present

## 2021-09-11 DIAGNOSIS — M1990 Unspecified osteoarthritis, unspecified site: Secondary | ICD-10-CM | POA: Diagnosis not present

## 2021-09-11 DIAGNOSIS — M542 Cervicalgia: Secondary | ICD-10-CM | POA: Diagnosis not present

## 2021-09-11 DIAGNOSIS — M0579 Rheumatoid arthritis with rheumatoid factor of multiple sites without organ or systems involvement: Secondary | ICD-10-CM | POA: Diagnosis not present

## 2021-09-11 DIAGNOSIS — Z79899 Other long term (current) drug therapy: Secondary | ICD-10-CM | POA: Diagnosis not present

## 2021-09-11 DIAGNOSIS — L509 Urticaria, unspecified: Secondary | ICD-10-CM | POA: Diagnosis not present

## 2021-09-11 DIAGNOSIS — G8929 Other chronic pain: Secondary | ICD-10-CM | POA: Diagnosis not present

## 2021-09-11 DIAGNOSIS — M79643 Pain in unspecified hand: Secondary | ICD-10-CM | POA: Diagnosis not present

## 2021-10-03 DIAGNOSIS — M2550 Pain in unspecified joint: Secondary | ICD-10-CM | POA: Diagnosis not present

## 2021-10-03 DIAGNOSIS — G894 Chronic pain syndrome: Secondary | ICD-10-CM | POA: Diagnosis not present

## 2021-10-03 DIAGNOSIS — M47812 Spondylosis without myelopathy or radiculopathy, cervical region: Secondary | ICD-10-CM | POA: Diagnosis not present

## 2021-10-03 DIAGNOSIS — M069 Rheumatoid arthritis, unspecified: Secondary | ICD-10-CM | POA: Diagnosis not present

## 2021-10-13 DIAGNOSIS — Z0000 Encounter for general adult medical examination without abnormal findings: Secondary | ICD-10-CM | POA: Diagnosis not present

## 2021-10-13 DIAGNOSIS — Z23 Encounter for immunization: Secondary | ICD-10-CM | POA: Diagnosis not present

## 2021-11-17 DIAGNOSIS — M0579 Rheumatoid arthritis with rheumatoid factor of multiple sites without organ or systems involvement: Secondary | ICD-10-CM | POA: Diagnosis not present

## 2021-11-17 DIAGNOSIS — K760 Fatty (change of) liver, not elsewhere classified: Secondary | ICD-10-CM | POA: Diagnosis not present

## 2021-11-17 DIAGNOSIS — R7309 Other abnormal glucose: Secondary | ICD-10-CM | POA: Diagnosis not present

## 2021-11-17 DIAGNOSIS — E782 Mixed hyperlipidemia: Secondary | ICD-10-CM | POA: Diagnosis not present

## 2021-11-21 DIAGNOSIS — Z0000 Encounter for general adult medical examination without abnormal findings: Secondary | ICD-10-CM | POA: Diagnosis not present

## 2021-11-21 DIAGNOSIS — K760 Fatty (change of) liver, not elsewhere classified: Secondary | ICD-10-CM | POA: Diagnosis not present

## 2021-11-21 DIAGNOSIS — M0579 Rheumatoid arthritis with rheumatoid factor of multiple sites without organ or systems involvement: Secondary | ICD-10-CM | POA: Diagnosis not present

## 2021-11-21 DIAGNOSIS — R7309 Other abnormal glucose: Secondary | ICD-10-CM | POA: Diagnosis not present

## 2021-11-21 DIAGNOSIS — E782 Mixed hyperlipidemia: Secondary | ICD-10-CM | POA: Diagnosis not present

## 2021-11-21 DIAGNOSIS — R69 Illness, unspecified: Secondary | ICD-10-CM | POA: Diagnosis not present

## 2021-11-22 DIAGNOSIS — Z20822 Contact with and (suspected) exposure to covid-19: Secondary | ICD-10-CM | POA: Diagnosis not present

## 2021-11-25 DIAGNOSIS — Z20822 Contact with and (suspected) exposure to covid-19: Secondary | ICD-10-CM | POA: Diagnosis not present

## 2021-11-30 DIAGNOSIS — Z20822 Contact with and (suspected) exposure to covid-19: Secondary | ICD-10-CM | POA: Diagnosis not present

## 2021-12-07 DIAGNOSIS — M069 Rheumatoid arthritis, unspecified: Secondary | ICD-10-CM | POA: Diagnosis not present

## 2021-12-07 DIAGNOSIS — Z79891 Long term (current) use of opiate analgesic: Secondary | ICD-10-CM | POA: Diagnosis not present

## 2021-12-07 DIAGNOSIS — M47812 Spondylosis without myelopathy or radiculopathy, cervical region: Secondary | ICD-10-CM | POA: Diagnosis not present

## 2021-12-07 DIAGNOSIS — G5600 Carpal tunnel syndrome, unspecified upper limb: Secondary | ICD-10-CM | POA: Diagnosis not present

## 2021-12-07 DIAGNOSIS — G894 Chronic pain syndrome: Secondary | ICD-10-CM | POA: Diagnosis not present

## 2021-12-07 DIAGNOSIS — Z79899 Other long term (current) drug therapy: Secondary | ICD-10-CM | POA: Diagnosis not present

## 2022-01-16 DIAGNOSIS — G894 Chronic pain syndrome: Secondary | ICD-10-CM | POA: Diagnosis not present

## 2022-01-16 DIAGNOSIS — M47812 Spondylosis without myelopathy or radiculopathy, cervical region: Secondary | ICD-10-CM | POA: Diagnosis not present

## 2022-01-16 DIAGNOSIS — M069 Rheumatoid arthritis, unspecified: Secondary | ICD-10-CM | POA: Diagnosis not present

## 2022-01-16 DIAGNOSIS — Z79891 Long term (current) use of opiate analgesic: Secondary | ICD-10-CM | POA: Diagnosis not present

## 2022-01-16 DIAGNOSIS — Z79899 Other long term (current) drug therapy: Secondary | ICD-10-CM | POA: Diagnosis not present

## 2022-01-30 DIAGNOSIS — M0579 Rheumatoid arthritis with rheumatoid factor of multiple sites without organ or systems involvement: Secondary | ICD-10-CM | POA: Diagnosis not present

## 2022-01-30 DIAGNOSIS — R7309 Other abnormal glucose: Secondary | ICD-10-CM | POA: Diagnosis not present

## 2022-01-30 DIAGNOSIS — R197 Diarrhea, unspecified: Secondary | ICD-10-CM | POA: Diagnosis not present

## 2022-01-30 DIAGNOSIS — L719 Rosacea, unspecified: Secondary | ICD-10-CM | POA: Diagnosis not present

## 2022-01-30 DIAGNOSIS — R072 Precordial pain: Secondary | ICD-10-CM | POA: Diagnosis not present

## 2022-02-05 DIAGNOSIS — M542 Cervicalgia: Secondary | ICD-10-CM | POA: Diagnosis not present

## 2022-02-05 DIAGNOSIS — M069 Rheumatoid arthritis, unspecified: Secondary | ICD-10-CM | POA: Diagnosis not present

## 2022-02-05 DIAGNOSIS — M25562 Pain in left knee: Secondary | ICD-10-CM | POA: Diagnosis not present

## 2022-02-05 DIAGNOSIS — Z79899 Other long term (current) drug therapy: Secondary | ICD-10-CM | POA: Diagnosis not present

## 2022-02-05 DIAGNOSIS — M25531 Pain in right wrist: Secondary | ICD-10-CM | POA: Diagnosis not present

## 2022-02-06 DIAGNOSIS — M542 Cervicalgia: Secondary | ICD-10-CM | POA: Diagnosis not present

## 2022-02-06 DIAGNOSIS — F17210 Nicotine dependence, cigarettes, uncomplicated: Secondary | ICD-10-CM | POA: Diagnosis not present

## 2022-02-06 DIAGNOSIS — M069 Rheumatoid arthritis, unspecified: Secondary | ICD-10-CM | POA: Diagnosis not present

## 2022-02-06 DIAGNOSIS — M25531 Pain in right wrist: Secondary | ICD-10-CM | POA: Diagnosis not present

## 2022-02-06 DIAGNOSIS — R69 Illness, unspecified: Secondary | ICD-10-CM | POA: Diagnosis not present

## 2022-02-06 DIAGNOSIS — M25562 Pain in left knee: Secondary | ICD-10-CM | POA: Diagnosis not present

## 2022-02-09 DIAGNOSIS — H02831 Dermatochalasis of right upper eyelid: Secondary | ICD-10-CM | POA: Diagnosis not present

## 2022-02-09 DIAGNOSIS — H02834 Dermatochalasis of left upper eyelid: Secondary | ICD-10-CM | POA: Diagnosis not present

## 2022-02-09 DIAGNOSIS — H527 Unspecified disorder of refraction: Secondary | ICD-10-CM | POA: Diagnosis not present

## 2022-02-09 DIAGNOSIS — H25813 Combined forms of age-related cataract, bilateral: Secondary | ICD-10-CM | POA: Diagnosis not present

## 2022-02-09 DIAGNOSIS — H35033 Hypertensive retinopathy, bilateral: Secondary | ICD-10-CM | POA: Diagnosis not present

## 2022-02-09 DIAGNOSIS — H43812 Vitreous degeneration, left eye: Secondary | ICD-10-CM | POA: Diagnosis not present

## 2022-02-09 DIAGNOSIS — H53001 Unspecified amblyopia, right eye: Secondary | ICD-10-CM | POA: Diagnosis not present

## 2022-03-07 DIAGNOSIS — M47812 Spondylosis without myelopathy or radiculopathy, cervical region: Secondary | ICD-10-CM | POA: Diagnosis not present

## 2022-03-07 DIAGNOSIS — M5030 Other cervical disc degeneration, unspecified cervical region: Secondary | ICD-10-CM | POA: Diagnosis not present

## 2022-03-07 DIAGNOSIS — M2550 Pain in unspecified joint: Secondary | ICD-10-CM | POA: Diagnosis not present

## 2022-03-07 DIAGNOSIS — M069 Rheumatoid arthritis, unspecified: Secondary | ICD-10-CM | POA: Diagnosis not present

## 2022-03-07 DIAGNOSIS — M25421 Effusion, right elbow: Secondary | ICD-10-CM | POA: Diagnosis not present

## 2022-03-26 DIAGNOSIS — H53001 Unspecified amblyopia, right eye: Secondary | ICD-10-CM | POA: Diagnosis not present

## 2022-03-26 DIAGNOSIS — H52203 Unspecified astigmatism, bilateral: Secondary | ICD-10-CM | POA: Diagnosis not present

## 2022-03-26 DIAGNOSIS — H2620 Unspecified complicated cataract: Secondary | ICD-10-CM | POA: Diagnosis not present

## 2022-03-26 DIAGNOSIS — H25812 Combined forms of age-related cataract, left eye: Secondary | ICD-10-CM | POA: Diagnosis not present

## 2022-03-27 DIAGNOSIS — H2620 Unspecified complicated cataract: Secondary | ICD-10-CM | POA: Diagnosis not present

## 2022-04-03 DIAGNOSIS — Z79899 Other long term (current) drug therapy: Secondary | ICD-10-CM | POA: Diagnosis not present

## 2022-04-03 DIAGNOSIS — Z888 Allergy status to other drugs, medicaments and biological substances status: Secondary | ICD-10-CM | POA: Diagnosis not present

## 2022-04-03 DIAGNOSIS — H2589 Other age-related cataract: Secondary | ICD-10-CM | POA: Diagnosis not present

## 2022-04-03 DIAGNOSIS — H2511 Age-related nuclear cataract, right eye: Secondary | ICD-10-CM | POA: Diagnosis not present

## 2022-04-03 DIAGNOSIS — M1990 Unspecified osteoarthritis, unspecified site: Secondary | ICD-10-CM | POA: Diagnosis not present

## 2022-04-03 DIAGNOSIS — R69 Illness, unspecified: Secondary | ICD-10-CM | POA: Diagnosis not present

## 2022-04-03 DIAGNOSIS — K219 Gastro-esophageal reflux disease without esophagitis: Secondary | ICD-10-CM | POA: Diagnosis not present

## 2022-04-03 DIAGNOSIS — H25811 Combined forms of age-related cataract, right eye: Secondary | ICD-10-CM | POA: Diagnosis not present

## 2022-04-10 DIAGNOSIS — H2589 Other age-related cataract: Secondary | ICD-10-CM | POA: Diagnosis not present

## 2022-04-10 DIAGNOSIS — Z79899 Other long term (current) drug therapy: Secondary | ICD-10-CM | POA: Diagnosis not present

## 2022-04-10 DIAGNOSIS — H269 Unspecified cataract: Secondary | ICD-10-CM | POA: Diagnosis not present

## 2022-04-10 DIAGNOSIS — R69 Illness, unspecified: Secondary | ICD-10-CM | POA: Diagnosis not present

## 2022-04-10 DIAGNOSIS — I10 Essential (primary) hypertension: Secondary | ICD-10-CM | POA: Diagnosis not present

## 2022-04-10 DIAGNOSIS — H2512 Age-related nuclear cataract, left eye: Secondary | ICD-10-CM | POA: Diagnosis not present

## 2022-04-10 DIAGNOSIS — H25812 Combined forms of age-related cataract, left eye: Secondary | ICD-10-CM | POA: Diagnosis not present

## 2022-04-10 DIAGNOSIS — K219 Gastro-esophageal reflux disease without esophagitis: Secondary | ICD-10-CM | POA: Diagnosis not present

## 2022-05-22 ENCOUNTER — Other Ambulatory Visit: Payer: Self-pay | Admitting: Internal Medicine

## 2022-05-22 DIAGNOSIS — Z1231 Encounter for screening mammogram for malignant neoplasm of breast: Secondary | ICD-10-CM

## 2022-05-24 ENCOUNTER — Ambulatory Visit: Payer: Medicare HMO

## 2022-05-30 DIAGNOSIS — E782 Mixed hyperlipidemia: Secondary | ICD-10-CM | POA: Diagnosis not present

## 2022-05-30 DIAGNOSIS — R7309 Other abnormal glucose: Secondary | ICD-10-CM | POA: Diagnosis not present

## 2022-05-30 DIAGNOSIS — K760 Fatty (change of) liver, not elsewhere classified: Secondary | ICD-10-CM | POA: Diagnosis not present

## 2022-05-31 ENCOUNTER — Ambulatory Visit (INDEPENDENT_AMBULATORY_CARE_PROVIDER_SITE_OTHER): Payer: Medicare HMO

## 2022-05-31 DIAGNOSIS — Z1231 Encounter for screening mammogram for malignant neoplasm of breast: Secondary | ICD-10-CM

## 2022-06-06 DIAGNOSIS — K760 Fatty (change of) liver, not elsewhere classified: Secondary | ICD-10-CM | POA: Diagnosis not present

## 2022-06-06 DIAGNOSIS — M0579 Rheumatoid arthritis with rheumatoid factor of multiple sites without organ or systems involvement: Secondary | ICD-10-CM | POA: Diagnosis not present

## 2022-06-06 DIAGNOSIS — E782 Mixed hyperlipidemia: Secondary | ICD-10-CM | POA: Diagnosis not present

## 2022-06-06 DIAGNOSIS — R7309 Other abnormal glucose: Secondary | ICD-10-CM | POA: Diagnosis not present

## 2022-06-06 DIAGNOSIS — R69 Illness, unspecified: Secondary | ICD-10-CM | POA: Diagnosis not present

## 2022-06-06 DIAGNOSIS — R519 Headache, unspecified: Secondary | ICD-10-CM | POA: Diagnosis not present

## 2022-06-06 DIAGNOSIS — I10 Essential (primary) hypertension: Secondary | ICD-10-CM | POA: Diagnosis not present

## 2022-07-27 DIAGNOSIS — E782 Mixed hyperlipidemia: Secondary | ICD-10-CM | POA: Diagnosis not present

## 2022-07-27 DIAGNOSIS — I10 Essential (primary) hypertension: Secondary | ICD-10-CM | POA: Diagnosis not present

## 2022-07-27 DIAGNOSIS — R7309 Other abnormal glucose: Secondary | ICD-10-CM | POA: Diagnosis not present

## 2022-07-31 DIAGNOSIS — R5383 Other fatigue: Secondary | ICD-10-CM | POA: Diagnosis not present

## 2022-07-31 DIAGNOSIS — M0579 Rheumatoid arthritis with rheumatoid factor of multiple sites without organ or systems involvement: Secondary | ICD-10-CM | POA: Diagnosis not present

## 2022-07-31 DIAGNOSIS — M546 Pain in thoracic spine: Secondary | ICD-10-CM | POA: Diagnosis not present

## 2022-07-31 DIAGNOSIS — R69 Illness, unspecified: Secondary | ICD-10-CM | POA: Diagnosis not present

## 2022-07-31 DIAGNOSIS — I10 Essential (primary) hypertension: Secondary | ICD-10-CM | POA: Diagnosis not present

## 2022-07-31 DIAGNOSIS — K760 Fatty (change of) liver, not elsewhere classified: Secondary | ICD-10-CM | POA: Diagnosis not present

## 2022-07-31 DIAGNOSIS — E782 Mixed hyperlipidemia: Secondary | ICD-10-CM | POA: Diagnosis not present

## 2022-07-31 DIAGNOSIS — R7309 Other abnormal glucose: Secondary | ICD-10-CM | POA: Diagnosis not present

## 2022-08-01 DIAGNOSIS — Z03818 Encounter for observation for suspected exposure to other biological agents ruled out: Secondary | ICD-10-CM | POA: Diagnosis not present

## 2022-08-01 DIAGNOSIS — I10 Essential (primary) hypertension: Secondary | ICD-10-CM | POA: Diagnosis not present

## 2022-08-01 DIAGNOSIS — Z20822 Contact with and (suspected) exposure to covid-19: Secondary | ICD-10-CM | POA: Diagnosis not present

## 2022-08-01 DIAGNOSIS — Z6831 Body mass index (BMI) 31.0-31.9, adult: Secondary | ICD-10-CM | POA: Diagnosis not present

## 2022-08-02 DIAGNOSIS — H2640 Unspecified secondary cataract: Secondary | ICD-10-CM | POA: Diagnosis not present

## 2022-08-02 DIAGNOSIS — H53001 Unspecified amblyopia, right eye: Secondary | ICD-10-CM | POA: Diagnosis not present

## 2022-08-02 DIAGNOSIS — Z961 Presence of intraocular lens: Secondary | ICD-10-CM | POA: Diagnosis not present

## 2022-08-13 DIAGNOSIS — R69 Illness, unspecified: Secondary | ICD-10-CM | POA: Diagnosis not present

## 2022-08-13 DIAGNOSIS — R053 Chronic cough: Secondary | ICD-10-CM | POA: Diagnosis not present

## 2022-08-13 DIAGNOSIS — R197 Diarrhea, unspecified: Secondary | ICD-10-CM | POA: Diagnosis not present

## 2022-08-13 DIAGNOSIS — R110 Nausea: Secondary | ICD-10-CM | POA: Diagnosis not present

## 2022-08-31 DIAGNOSIS — R69 Illness, unspecified: Secondary | ICD-10-CM | POA: Diagnosis not present

## 2022-08-31 DIAGNOSIS — R053 Chronic cough: Secondary | ICD-10-CM | POA: Diagnosis not present

## 2022-08-31 DIAGNOSIS — R110 Nausea: Secondary | ICD-10-CM | POA: Diagnosis not present

## 2022-08-31 DIAGNOSIS — R519 Headache, unspecified: Secondary | ICD-10-CM | POA: Diagnosis not present

## 2022-08-31 DIAGNOSIS — R197 Diarrhea, unspecified: Secondary | ICD-10-CM | POA: Diagnosis not present

## 2022-08-31 DIAGNOSIS — Z23 Encounter for immunization: Secondary | ICD-10-CM | POA: Diagnosis not present

## 2022-09-13 DIAGNOSIS — Z0000 Encounter for general adult medical examination without abnormal findings: Secondary | ICD-10-CM | POA: Diagnosis not present

## 2022-09-13 DIAGNOSIS — R110 Nausea: Secondary | ICD-10-CM | POA: Diagnosis not present

## 2022-09-13 DIAGNOSIS — Z13228 Encounter for screening for other metabolic disorders: Secondary | ICD-10-CM | POA: Diagnosis not present

## 2022-09-13 DIAGNOSIS — Z130 Encounter for screening for diseases of the blood and blood-forming organs and certain disorders involving the immune mechanism: Secondary | ICD-10-CM | POA: Diagnosis not present

## 2022-09-13 DIAGNOSIS — Z008 Encounter for other general examination: Secondary | ICD-10-CM | POA: Diagnosis not present

## 2022-09-13 DIAGNOSIS — F411 Generalized anxiety disorder: Secondary | ICD-10-CM | POA: Diagnosis not present

## 2022-09-13 DIAGNOSIS — R69 Illness, unspecified: Secondary | ICD-10-CM | POA: Diagnosis not present

## 2022-09-13 DIAGNOSIS — Z1329 Encounter for screening for other suspected endocrine disorder: Secondary | ICD-10-CM | POA: Diagnosis not present

## 2022-09-13 DIAGNOSIS — F321 Major depressive disorder, single episode, moderate: Secondary | ICD-10-CM | POA: Diagnosis not present

## 2022-09-14 DIAGNOSIS — J329 Chronic sinusitis, unspecified: Secondary | ICD-10-CM | POA: Diagnosis not present

## 2022-09-14 DIAGNOSIS — K219 Gastro-esophageal reflux disease without esophagitis: Secondary | ICD-10-CM | POA: Diagnosis not present

## 2022-10-16 DIAGNOSIS — J329 Chronic sinusitis, unspecified: Secondary | ICD-10-CM | POA: Diagnosis not present

## 2022-10-18 DIAGNOSIS — F331 Major depressive disorder, recurrent, moderate: Secondary | ICD-10-CM | POA: Diagnosis not present

## 2022-10-18 DIAGNOSIS — F419 Anxiety disorder, unspecified: Secondary | ICD-10-CM | POA: Diagnosis not present

## 2022-10-18 DIAGNOSIS — R69 Illness, unspecified: Secondary | ICD-10-CM | POA: Diagnosis not present

## 2022-10-23 DIAGNOSIS — Z5181 Encounter for therapeutic drug level monitoring: Secondary | ICD-10-CM | POA: Diagnosis not present

## 2022-10-23 DIAGNOSIS — M25522 Pain in left elbow: Secondary | ICD-10-CM | POA: Diagnosis not present

## 2022-10-23 DIAGNOSIS — M25521 Pain in right elbow: Secondary | ICD-10-CM | POA: Diagnosis not present

## 2022-10-23 DIAGNOSIS — M79672 Pain in left foot: Secondary | ICD-10-CM | POA: Diagnosis not present

## 2022-10-23 DIAGNOSIS — M79671 Pain in right foot: Secondary | ICD-10-CM | POA: Diagnosis not present

## 2022-10-23 DIAGNOSIS — M069 Rheumatoid arthritis, unspecified: Secondary | ICD-10-CM | POA: Diagnosis not present

## 2022-10-23 DIAGNOSIS — M2550 Pain in unspecified joint: Secondary | ICD-10-CM | POA: Diagnosis not present

## 2022-10-23 DIAGNOSIS — M4722 Other spondylosis with radiculopathy, cervical region: Secondary | ICD-10-CM | POA: Diagnosis not present

## 2022-10-23 DIAGNOSIS — Z79891 Long term (current) use of opiate analgesic: Secondary | ICD-10-CM | POA: Diagnosis not present

## 2022-10-24 DIAGNOSIS — R69 Illness, unspecified: Secondary | ICD-10-CM | POA: Diagnosis not present

## 2022-10-24 DIAGNOSIS — F419 Anxiety disorder, unspecified: Secondary | ICD-10-CM | POA: Diagnosis not present

## 2022-10-25 DIAGNOSIS — Z0000 Encounter for general adult medical examination without abnormal findings: Secondary | ICD-10-CM | POA: Diagnosis not present

## 2022-10-25 DIAGNOSIS — E6609 Other obesity due to excess calories: Secondary | ICD-10-CM | POA: Diagnosis not present

## 2022-10-25 DIAGNOSIS — I10 Essential (primary) hypertension: Secondary | ICD-10-CM | POA: Diagnosis not present

## 2022-10-25 DIAGNOSIS — R69 Illness, unspecified: Secondary | ICD-10-CM | POA: Diagnosis not present

## 2022-10-25 DIAGNOSIS — Z6831 Body mass index (BMI) 31.0-31.9, adult: Secondary | ICD-10-CM | POA: Diagnosis not present

## 2022-10-25 DIAGNOSIS — F411 Generalized anxiety disorder: Secondary | ICD-10-CM | POA: Diagnosis not present

## 2022-10-25 DIAGNOSIS — E782 Mixed hyperlipidemia: Secondary | ICD-10-CM | POA: Diagnosis not present

## 2022-10-30 DIAGNOSIS — U071 COVID-19: Secondary | ICD-10-CM | POA: Diagnosis not present

## 2022-10-30 DIAGNOSIS — R82998 Other abnormal findings in urine: Secondary | ICD-10-CM | POA: Diagnosis not present

## 2022-10-30 DIAGNOSIS — R0981 Nasal congestion: Secondary | ICD-10-CM | POA: Diagnosis not present

## 2022-10-30 DIAGNOSIS — R509 Fever, unspecified: Secondary | ICD-10-CM | POA: Diagnosis not present

## 2022-10-30 DIAGNOSIS — R300 Dysuria: Secondary | ICD-10-CM | POA: Diagnosis not present

## 2022-11-07 DIAGNOSIS — K921 Melena: Secondary | ICD-10-CM | POA: Diagnosis not present

## 2022-11-07 DIAGNOSIS — R1319 Other dysphagia: Secondary | ICD-10-CM | POA: Diagnosis not present

## 2022-11-13 DIAGNOSIS — R300 Dysuria: Secondary | ICD-10-CM | POA: Diagnosis not present

## 2022-11-15 DIAGNOSIS — J3489 Other specified disorders of nose and nasal sinuses: Secondary | ICD-10-CM | POA: Diagnosis not present

## 2022-11-15 DIAGNOSIS — J329 Chronic sinusitis, unspecified: Secondary | ICD-10-CM | POA: Diagnosis not present

## 2022-11-16 DIAGNOSIS — M50121 Cervical disc disorder at C4-C5 level with radiculopathy: Secondary | ICD-10-CM | POA: Diagnosis not present

## 2022-11-16 DIAGNOSIS — M5013 Cervical disc disorder with radiculopathy, cervicothoracic region: Secondary | ICD-10-CM | POA: Diagnosis not present

## 2022-11-16 DIAGNOSIS — M50122 Cervical disc disorder at C5-C6 level with radiculopathy: Secondary | ICD-10-CM | POA: Diagnosis not present

## 2022-11-16 DIAGNOSIS — M5011 Cervical disc disorder with radiculopathy,  high cervical region: Secondary | ICD-10-CM | POA: Diagnosis not present

## 2022-11-16 DIAGNOSIS — M4722 Other spondylosis with radiculopathy, cervical region: Secondary | ICD-10-CM | POA: Diagnosis not present

## 2022-11-21 DIAGNOSIS — F331 Major depressive disorder, recurrent, moderate: Secondary | ICD-10-CM | POA: Diagnosis not present

## 2022-11-21 DIAGNOSIS — R69 Illness, unspecified: Secondary | ICD-10-CM | POA: Diagnosis not present

## 2022-11-21 DIAGNOSIS — F419 Anxiety disorder, unspecified: Secondary | ICD-10-CM | POA: Diagnosis not present

## 2022-12-03 DIAGNOSIS — K6389 Other specified diseases of intestine: Secondary | ICD-10-CM | POA: Diagnosis not present

## 2022-12-03 DIAGNOSIS — K921 Melena: Secondary | ICD-10-CM | POA: Diagnosis not present

## 2022-12-03 DIAGNOSIS — R1084 Generalized abdominal pain: Secondary | ICD-10-CM | POA: Diagnosis not present

## 2022-12-03 DIAGNOSIS — B3781 Candidal esophagitis: Secondary | ICD-10-CM | POA: Diagnosis not present

## 2022-12-03 DIAGNOSIS — R1310 Dysphagia, unspecified: Secondary | ICD-10-CM | POA: Diagnosis not present

## 2022-12-03 DIAGNOSIS — K2289 Other specified disease of esophagus: Secondary | ICD-10-CM | POA: Diagnosis not present

## 2022-12-03 DIAGNOSIS — K2960 Other gastritis without bleeding: Secondary | ICD-10-CM | POA: Diagnosis not present

## 2022-12-03 DIAGNOSIS — R197 Diarrhea, unspecified: Secondary | ICD-10-CM | POA: Diagnosis not present

## 2022-12-03 DIAGNOSIS — K649 Unspecified hemorrhoids: Secondary | ICD-10-CM | POA: Diagnosis not present

## 2022-12-04 DIAGNOSIS — F419 Anxiety disorder, unspecified: Secondary | ICD-10-CM | POA: Diagnosis not present

## 2022-12-04 DIAGNOSIS — F331 Major depressive disorder, recurrent, moderate: Secondary | ICD-10-CM | POA: Diagnosis not present

## 2022-12-04 DIAGNOSIS — R69 Illness, unspecified: Secondary | ICD-10-CM | POA: Diagnosis not present

## 2022-12-06 DIAGNOSIS — R69 Illness, unspecified: Secondary | ICD-10-CM | POA: Diagnosis not present

## 2022-12-06 DIAGNOSIS — Z01419 Encounter for gynecological examination (general) (routine) without abnormal findings: Secondary | ICD-10-CM | POA: Diagnosis not present

## 2022-12-06 DIAGNOSIS — F411 Generalized anxiety disorder: Secondary | ICD-10-CM | POA: Diagnosis not present

## 2022-12-10 DIAGNOSIS — J321 Chronic frontal sinusitis: Secondary | ICD-10-CM | POA: Diagnosis not present

## 2022-12-10 DIAGNOSIS — J322 Chronic ethmoidal sinusitis: Secondary | ICD-10-CM | POA: Diagnosis not present

## 2022-12-10 DIAGNOSIS — J3489 Other specified disorders of nose and nasal sinuses: Secondary | ICD-10-CM | POA: Diagnosis not present

## 2022-12-10 DIAGNOSIS — J329 Chronic sinusitis, unspecified: Secondary | ICD-10-CM | POA: Diagnosis not present

## 2022-12-10 DIAGNOSIS — J343 Hypertrophy of nasal turbinates: Secondary | ICD-10-CM | POA: Diagnosis not present

## 2022-12-10 DIAGNOSIS — R519 Headache, unspecified: Secondary | ICD-10-CM | POA: Diagnosis not present

## 2022-12-10 DIAGNOSIS — J330 Polyp of nasal cavity: Secondary | ICD-10-CM | POA: Diagnosis not present

## 2022-12-10 DIAGNOSIS — J320 Chronic maxillary sinusitis: Secondary | ICD-10-CM | POA: Diagnosis not present

## 2022-12-10 DIAGNOSIS — J323 Chronic sphenoidal sinusitis: Secondary | ICD-10-CM | POA: Diagnosis not present

## 2022-12-24 DIAGNOSIS — K760 Fatty (change of) liver, not elsewhere classified: Secondary | ICD-10-CM | POA: Diagnosis not present

## 2022-12-24 DIAGNOSIS — I708 Atherosclerosis of other arteries: Secondary | ICD-10-CM | POA: Diagnosis not present

## 2022-12-24 DIAGNOSIS — R1084 Generalized abdominal pain: Secondary | ICD-10-CM | POA: Diagnosis not present

## 2023-01-02 DIAGNOSIS — Z9889 Other specified postprocedural states: Secondary | ICD-10-CM | POA: Diagnosis not present

## 2023-01-02 DIAGNOSIS — J343 Hypertrophy of nasal turbinates: Secondary | ICD-10-CM | POA: Diagnosis not present

## 2023-01-02 DIAGNOSIS — J329 Chronic sinusitis, unspecified: Secondary | ICD-10-CM | POA: Diagnosis not present

## 2023-01-03 DIAGNOSIS — M5030 Other cervical disc degeneration, unspecified cervical region: Secondary | ICD-10-CM | POA: Diagnosis not present

## 2023-01-03 DIAGNOSIS — M79672 Pain in left foot: Secondary | ICD-10-CM | POA: Diagnosis not present

## 2023-01-03 DIAGNOSIS — M4802 Spinal stenosis, cervical region: Secondary | ICD-10-CM | POA: Diagnosis not present

## 2023-01-03 DIAGNOSIS — M069 Rheumatoid arthritis, unspecified: Secondary | ICD-10-CM | POA: Diagnosis not present

## 2023-01-03 DIAGNOSIS — Z8669 Personal history of other diseases of the nervous system and sense organs: Secondary | ICD-10-CM | POA: Diagnosis not present

## 2023-01-03 DIAGNOSIS — M47812 Spondylosis without myelopathy or radiculopathy, cervical region: Secondary | ICD-10-CM | POA: Diagnosis not present

## 2023-01-03 DIAGNOSIS — M25522 Pain in left elbow: Secondary | ICD-10-CM | POA: Diagnosis not present

## 2023-01-03 DIAGNOSIS — M25521 Pain in right elbow: Secondary | ICD-10-CM | POA: Diagnosis not present

## 2023-01-03 DIAGNOSIS — M79671 Pain in right foot: Secondary | ICD-10-CM | POA: Diagnosis not present

## 2023-01-03 DIAGNOSIS — M4722 Other spondylosis with radiculopathy, cervical region: Secondary | ICD-10-CM | POA: Diagnosis not present

## 2023-01-03 DIAGNOSIS — M2550 Pain in unspecified joint: Secondary | ICD-10-CM | POA: Diagnosis not present

## 2023-01-03 DIAGNOSIS — G894 Chronic pain syndrome: Secondary | ICD-10-CM | POA: Diagnosis not present

## 2023-01-11 DIAGNOSIS — R519 Headache, unspecified: Secondary | ICD-10-CM | POA: Diagnosis not present

## 2023-01-16 DIAGNOSIS — M7021 Olecranon bursitis, right elbow: Secondary | ICD-10-CM | POA: Diagnosis not present

## 2023-01-16 DIAGNOSIS — M25521 Pain in right elbow: Secondary | ICD-10-CM | POA: Diagnosis not present

## 2023-01-18 DIAGNOSIS — R519 Headache, unspecified: Secondary | ICD-10-CM | POA: Diagnosis not present

## 2023-01-18 DIAGNOSIS — R9082 White matter disease, unspecified: Secondary | ICD-10-CM | POA: Diagnosis not present

## 2023-02-06 DIAGNOSIS — J320 Chronic maxillary sinusitis: Secondary | ICD-10-CM | POA: Diagnosis not present

## 2023-02-06 DIAGNOSIS — J309 Allergic rhinitis, unspecified: Secondary | ICD-10-CM | POA: Diagnosis not present

## 2023-02-06 DIAGNOSIS — J341 Cyst and mucocele of nose and nasal sinus: Secondary | ICD-10-CM | POA: Diagnosis not present

## 2023-03-04 DIAGNOSIS — M069 Rheumatoid arthritis, unspecified: Secondary | ICD-10-CM | POA: Diagnosis not present

## 2023-03-04 DIAGNOSIS — R52 Pain, unspecified: Secondary | ICD-10-CM | POA: Diagnosis not present

## 2023-03-04 DIAGNOSIS — M659 Synovitis and tenosynovitis, unspecified: Secondary | ICD-10-CM | POA: Diagnosis not present

## 2023-03-04 DIAGNOSIS — G588 Other specified mononeuropathies: Secondary | ICD-10-CM | POA: Diagnosis not present

## 2023-03-07 DIAGNOSIS — F3341 Major depressive disorder, recurrent, in partial remission: Secondary | ICD-10-CM | POA: Diagnosis not present

## 2023-03-07 DIAGNOSIS — B370 Candidal stomatitis: Secondary | ICD-10-CM | POA: Diagnosis not present

## 2023-03-07 DIAGNOSIS — F411 Generalized anxiety disorder: Secondary | ICD-10-CM | POA: Diagnosis not present

## 2023-03-19 DIAGNOSIS — E785 Hyperlipidemia, unspecified: Secondary | ICD-10-CM | POA: Diagnosis not present

## 2023-03-19 DIAGNOSIS — E559 Vitamin D deficiency, unspecified: Secondary | ICD-10-CM | POA: Diagnosis not present

## 2023-03-19 DIAGNOSIS — R7303 Prediabetes: Secondary | ICD-10-CM | POA: Diagnosis not present

## 2023-03-19 DIAGNOSIS — Z79899 Other long term (current) drug therapy: Secondary | ICD-10-CM | POA: Diagnosis not present

## 2023-03-25 DIAGNOSIS — F331 Major depressive disorder, recurrent, moderate: Secondary | ICD-10-CM | POA: Diagnosis not present

## 2023-03-25 DIAGNOSIS — F411 Generalized anxiety disorder: Secondary | ICD-10-CM | POA: Diagnosis not present

## 2023-03-26 DIAGNOSIS — M0579 Rheumatoid arthritis with rheumatoid factor of multiple sites without organ or systems involvement: Secondary | ICD-10-CM | POA: Diagnosis not present

## 2023-03-26 DIAGNOSIS — D84821 Immunodeficiency due to drugs: Secondary | ICD-10-CM | POA: Diagnosis not present

## 2023-03-26 DIAGNOSIS — Z79899 Other long term (current) drug therapy: Secondary | ICD-10-CM | POA: Diagnosis not present

## 2023-04-03 DIAGNOSIS — G4700 Insomnia, unspecified: Secondary | ICD-10-CM | POA: Diagnosis not present

## 2023-04-03 DIAGNOSIS — Z0000 Encounter for general adult medical examination without abnormal findings: Secondary | ICD-10-CM | POA: Diagnosis not present

## 2023-04-03 DIAGNOSIS — Z2821 Immunization not carried out because of patient refusal: Secondary | ICD-10-CM | POA: Diagnosis not present

## 2023-04-03 DIAGNOSIS — R682 Dry mouth, unspecified: Secondary | ICD-10-CM | POA: Diagnosis not present

## 2023-04-03 DIAGNOSIS — J329 Chronic sinusitis, unspecified: Secondary | ICD-10-CM | POA: Diagnosis not present

## 2023-04-03 DIAGNOSIS — R7303 Prediabetes: Secondary | ICD-10-CM | POA: Diagnosis not present

## 2023-04-03 DIAGNOSIS — E559 Vitamin D deficiency, unspecified: Secondary | ICD-10-CM | POA: Diagnosis not present

## 2023-04-03 DIAGNOSIS — F411 Generalized anxiety disorder: Secondary | ICD-10-CM | POA: Diagnosis not present

## 2023-04-04 DIAGNOSIS — M4722 Other spondylosis with radiculopathy, cervical region: Secondary | ICD-10-CM | POA: Diagnosis not present

## 2023-04-04 DIAGNOSIS — G588 Other specified mononeuropathies: Secondary | ICD-10-CM | POA: Diagnosis not present

## 2023-04-04 DIAGNOSIS — M659 Synovitis and tenosynovitis, unspecified: Secondary | ICD-10-CM | POA: Diagnosis not present

## 2023-04-04 DIAGNOSIS — M4802 Spinal stenosis, cervical region: Secondary | ICD-10-CM | POA: Diagnosis not present

## 2023-04-04 DIAGNOSIS — M069 Rheumatoid arthritis, unspecified: Secondary | ICD-10-CM | POA: Diagnosis not present

## 2023-04-04 DIAGNOSIS — M5030 Other cervical disc degeneration, unspecified cervical region: Secondary | ICD-10-CM | POA: Diagnosis not present

## 2023-04-09 DIAGNOSIS — F331 Major depressive disorder, recurrent, moderate: Secondary | ICD-10-CM | POA: Diagnosis not present

## 2023-04-09 DIAGNOSIS — F411 Generalized anxiety disorder: Secondary | ICD-10-CM | POA: Diagnosis not present

## 2023-04-22 DIAGNOSIS — F331 Major depressive disorder, recurrent, moderate: Secondary | ICD-10-CM | POA: Diagnosis not present

## 2023-04-22 DIAGNOSIS — F411 Generalized anxiety disorder: Secondary | ICD-10-CM | POA: Diagnosis not present

## 2023-04-23 DIAGNOSIS — R519 Headache, unspecified: Secondary | ICD-10-CM | POA: Diagnosis not present

## 2023-04-23 DIAGNOSIS — K219 Gastro-esophageal reflux disease without esophagitis: Secondary | ICD-10-CM | POA: Diagnosis not present

## 2023-04-23 DIAGNOSIS — R1319 Other dysphagia: Secondary | ICD-10-CM | POA: Diagnosis not present

## 2023-04-26 DIAGNOSIS — M0579 Rheumatoid arthritis with rheumatoid factor of multiple sites without organ or systems involvement: Secondary | ICD-10-CM | POA: Diagnosis not present

## 2023-05-01 DIAGNOSIS — M545 Low back pain, unspecified: Secondary | ICD-10-CM | POA: Diagnosis not present

## 2023-05-01 DIAGNOSIS — Z79899 Other long term (current) drug therapy: Secondary | ICD-10-CM | POA: Diagnosis not present

## 2023-05-01 DIAGNOSIS — M0579 Rheumatoid arthritis with rheumatoid factor of multiple sites without organ or systems involvement: Secondary | ICD-10-CM | POA: Diagnosis not present

## 2023-05-01 DIAGNOSIS — M3500 Sicca syndrome, unspecified: Secondary | ICD-10-CM | POA: Diagnosis not present

## 2023-05-01 DIAGNOSIS — D84821 Immunodeficiency due to drugs: Secondary | ICD-10-CM | POA: Diagnosis not present

## 2023-05-01 DIAGNOSIS — G8929 Other chronic pain: Secondary | ICD-10-CM | POA: Diagnosis not present

## 2023-05-10 DIAGNOSIS — F332 Major depressive disorder, recurrent severe without psychotic features: Secondary | ICD-10-CM | POA: Diagnosis not present

## 2023-05-10 DIAGNOSIS — F411 Generalized anxiety disorder: Secondary | ICD-10-CM | POA: Diagnosis not present

## 2023-05-14 DIAGNOSIS — K2289 Other specified disease of esophagus: Secondary | ICD-10-CM | POA: Diagnosis not present

## 2023-05-14 DIAGNOSIS — K222 Esophageal obstruction: Secondary | ICD-10-CM | POA: Diagnosis not present

## 2023-05-14 DIAGNOSIS — R1310 Dysphagia, unspecified: Secondary | ICD-10-CM | POA: Diagnosis not present

## 2023-05-21 DIAGNOSIS — Z0189 Encounter for other specified special examinations: Secondary | ICD-10-CM | POA: Diagnosis not present

## 2023-05-22 DIAGNOSIS — B9689 Other specified bacterial agents as the cause of diseases classified elsewhere: Secondary | ICD-10-CM | POA: Diagnosis not present

## 2023-05-22 DIAGNOSIS — J019 Acute sinusitis, unspecified: Secondary | ICD-10-CM | POA: Diagnosis not present

## 2023-05-24 DIAGNOSIS — F332 Major depressive disorder, recurrent severe without psychotic features: Secondary | ICD-10-CM | POA: Diagnosis not present

## 2023-05-24 DIAGNOSIS — F411 Generalized anxiety disorder: Secondary | ICD-10-CM | POA: Diagnosis not present

## 2023-05-29 DIAGNOSIS — R112 Nausea with vomiting, unspecified: Secondary | ICD-10-CM | POA: Diagnosis not present

## 2023-05-29 DIAGNOSIS — R1013 Epigastric pain: Secondary | ICD-10-CM | POA: Diagnosis not present

## 2023-05-29 DIAGNOSIS — R197 Diarrhea, unspecified: Secondary | ICD-10-CM | POA: Diagnosis not present

## 2023-05-29 DIAGNOSIS — R1319 Other dysphagia: Secondary | ICD-10-CM | POA: Diagnosis not present

## 2023-05-29 DIAGNOSIS — K219 Gastro-esophageal reflux disease without esophagitis: Secondary | ICD-10-CM | POA: Diagnosis not present

## 2023-06-04 DIAGNOSIS — R112 Nausea with vomiting, unspecified: Secondary | ICD-10-CM | POA: Diagnosis not present

## 2023-06-04 DIAGNOSIS — K219 Gastro-esophageal reflux disease without esophagitis: Secondary | ICD-10-CM | POA: Diagnosis not present

## 2023-06-04 DIAGNOSIS — I708 Atherosclerosis of other arteries: Secondary | ICD-10-CM | POA: Diagnosis not present

## 2023-06-04 DIAGNOSIS — K5730 Diverticulosis of large intestine without perforation or abscess without bleeding: Secondary | ICD-10-CM | POA: Diagnosis not present

## 2023-06-04 DIAGNOSIS — R197 Diarrhea, unspecified: Secondary | ICD-10-CM | POA: Diagnosis not present

## 2023-06-04 DIAGNOSIS — R1013 Epigastric pain: Secondary | ICD-10-CM | POA: Diagnosis not present

## 2023-06-05 DIAGNOSIS — R112 Nausea with vomiting, unspecified: Secondary | ICD-10-CM | POA: Diagnosis not present

## 2023-06-05 DIAGNOSIS — R197 Diarrhea, unspecified: Secondary | ICD-10-CM | POA: Diagnosis not present

## 2023-06-05 DIAGNOSIS — R1013 Epigastric pain: Secondary | ICD-10-CM | POA: Diagnosis not present

## 2023-06-09 DIAGNOSIS — Z7952 Long term (current) use of systemic steroids: Secondary | ICD-10-CM | POA: Diagnosis not present

## 2023-06-09 DIAGNOSIS — K219 Gastro-esophageal reflux disease without esophagitis: Secondary | ICD-10-CM | POA: Diagnosis not present

## 2023-06-09 DIAGNOSIS — Z87891 Personal history of nicotine dependence: Secondary | ICD-10-CM | POA: Diagnosis not present

## 2023-06-09 DIAGNOSIS — E669 Obesity, unspecified: Secondary | ICD-10-CM | POA: Diagnosis not present

## 2023-06-09 DIAGNOSIS — A0472 Enterocolitis due to Clostridium difficile, not specified as recurrent: Secondary | ICD-10-CM | POA: Diagnosis not present

## 2023-06-09 DIAGNOSIS — Z6830 Body mass index (BMI) 30.0-30.9, adult: Secondary | ICD-10-CM | POA: Diagnosis not present

## 2023-06-09 DIAGNOSIS — Z981 Arthrodesis status: Secondary | ICD-10-CM | POA: Diagnosis not present

## 2023-06-09 DIAGNOSIS — F411 Generalized anxiety disorder: Secondary | ICD-10-CM | POA: Diagnosis not present

## 2023-06-09 DIAGNOSIS — K760 Fatty (change of) liver, not elsewhere classified: Secondary | ICD-10-CM | POA: Diagnosis not present

## 2023-06-09 DIAGNOSIS — E860 Dehydration: Secondary | ICD-10-CM | POA: Diagnosis not present

## 2023-06-09 DIAGNOSIS — R531 Weakness: Secondary | ICD-10-CM | POA: Diagnosis not present

## 2023-06-09 DIAGNOSIS — R1013 Epigastric pain: Secondary | ICD-10-CM | POA: Diagnosis not present

## 2023-06-09 DIAGNOSIS — R590 Localized enlarged lymph nodes: Secondary | ICD-10-CM | POA: Diagnosis not present

## 2023-06-09 DIAGNOSIS — Z800 Family history of malignant neoplasm of digestive organs: Secondary | ICD-10-CM | POA: Diagnosis not present

## 2023-06-09 DIAGNOSIS — J329 Chronic sinusitis, unspecified: Secondary | ICD-10-CM | POA: Diagnosis not present

## 2023-06-09 DIAGNOSIS — I10 Essential (primary) hypertension: Secondary | ICD-10-CM | POA: Diagnosis not present

## 2023-06-09 DIAGNOSIS — G629 Polyneuropathy, unspecified: Secondary | ICD-10-CM | POA: Diagnosis not present

## 2023-06-09 DIAGNOSIS — R197 Diarrhea, unspecified: Secondary | ICD-10-CM | POA: Diagnosis not present

## 2023-06-09 DIAGNOSIS — K5730 Diverticulosis of large intestine without perforation or abscess without bleeding: Secondary | ICD-10-CM | POA: Diagnosis not present

## 2023-06-09 DIAGNOSIS — M069 Rheumatoid arthritis, unspecified: Secondary | ICD-10-CM | POA: Diagnosis not present

## 2023-06-09 DIAGNOSIS — I700 Atherosclerosis of aorta: Secondary | ICD-10-CM | POA: Diagnosis not present

## 2023-06-10 DIAGNOSIS — E860 Dehydration: Secondary | ICD-10-CM | POA: Diagnosis not present

## 2023-06-10 DIAGNOSIS — R112 Nausea with vomiting, unspecified: Secondary | ICD-10-CM | POA: Diagnosis not present

## 2023-06-10 DIAGNOSIS — R197 Diarrhea, unspecified: Secondary | ICD-10-CM | POA: Diagnosis not present

## 2023-06-10 DIAGNOSIS — A0472 Enterocolitis due to Clostridium difficile, not specified as recurrent: Secondary | ICD-10-CM | POA: Diagnosis not present

## 2023-06-11 DIAGNOSIS — A0472 Enterocolitis due to Clostridium difficile, not specified as recurrent: Secondary | ICD-10-CM | POA: Diagnosis not present

## 2023-06-12 DIAGNOSIS — A0472 Enterocolitis due to Clostridium difficile, not specified as recurrent: Secondary | ICD-10-CM | POA: Diagnosis not present

## 2023-06-19 DIAGNOSIS — D649 Anemia, unspecified: Secondary | ICD-10-CM | POA: Diagnosis not present

## 2023-06-19 DIAGNOSIS — A0472 Enterocolitis due to Clostridium difficile, not specified as recurrent: Secondary | ICD-10-CM | POA: Diagnosis not present

## 2023-06-19 DIAGNOSIS — G6281 Critical illness polyneuropathy: Secondary | ICD-10-CM | POA: Diagnosis not present

## 2023-06-20 DIAGNOSIS — R1013 Epigastric pain: Secondary | ICD-10-CM | POA: Diagnosis not present

## 2023-06-20 DIAGNOSIS — A0472 Enterocolitis due to Clostridium difficile, not specified as recurrent: Secondary | ICD-10-CM | POA: Diagnosis not present

## 2023-07-02 DIAGNOSIS — K219 Gastro-esophageal reflux disease without esophagitis: Secondary | ICD-10-CM | POA: Diagnosis not present

## 2023-07-02 DIAGNOSIS — R110 Nausea: Secondary | ICD-10-CM | POA: Diagnosis not present

## 2023-07-02 DIAGNOSIS — A0472 Enterocolitis due to Clostridium difficile, not specified as recurrent: Secondary | ICD-10-CM | POA: Diagnosis not present

## 2023-07-02 DIAGNOSIS — R197 Diarrhea, unspecified: Secondary | ICD-10-CM | POA: Diagnosis not present

## 2023-07-04 DIAGNOSIS — F3341 Major depressive disorder, recurrent, in partial remission: Secondary | ICD-10-CM | POA: Diagnosis not present

## 2023-07-04 DIAGNOSIS — G63 Polyneuropathy in diseases classified elsewhere: Secondary | ICD-10-CM | POA: Diagnosis not present

## 2023-07-04 DIAGNOSIS — M0579 Rheumatoid arthritis with rheumatoid factor of multiple sites without organ or systems involvement: Secondary | ICD-10-CM | POA: Diagnosis not present

## 2023-07-04 DIAGNOSIS — Z1330 Encounter for screening examination for mental health and behavioral disorders, unspecified: Secondary | ICD-10-CM | POA: Diagnosis not present

## 2023-07-04 DIAGNOSIS — F411 Generalized anxiety disorder: Secondary | ICD-10-CM | POA: Diagnosis not present

## 2023-07-04 DIAGNOSIS — Z1231 Encounter for screening mammogram for malignant neoplasm of breast: Secondary | ICD-10-CM | POA: Diagnosis not present

## 2023-07-05 DIAGNOSIS — F5101 Primary insomnia: Secondary | ICD-10-CM | POA: Diagnosis not present

## 2023-07-05 DIAGNOSIS — F332 Major depressive disorder, recurrent severe without psychotic features: Secondary | ICD-10-CM | POA: Diagnosis not present

## 2023-07-05 DIAGNOSIS — F411 Generalized anxiety disorder: Secondary | ICD-10-CM | POA: Diagnosis not present

## 2023-07-09 DIAGNOSIS — M4722 Other spondylosis with radiculopathy, cervical region: Secondary | ICD-10-CM | POA: Diagnosis not present

## 2023-07-09 DIAGNOSIS — M069 Rheumatoid arthritis, unspecified: Secondary | ICD-10-CM | POA: Diagnosis not present

## 2023-07-09 DIAGNOSIS — Z8669 Personal history of other diseases of the nervous system and sense organs: Secondary | ICD-10-CM | POA: Diagnosis not present

## 2023-07-09 DIAGNOSIS — M47812 Spondylosis without myelopathy or radiculopathy, cervical region: Secondary | ICD-10-CM | POA: Diagnosis not present

## 2023-07-09 DIAGNOSIS — M2550 Pain in unspecified joint: Secondary | ICD-10-CM | POA: Diagnosis not present

## 2023-07-09 DIAGNOSIS — M4802 Spinal stenosis, cervical region: Secondary | ICD-10-CM | POA: Diagnosis not present

## 2023-07-09 DIAGNOSIS — G894 Chronic pain syndrome: Secondary | ICD-10-CM | POA: Diagnosis not present

## 2023-07-09 DIAGNOSIS — M5030 Other cervical disc degeneration, unspecified cervical region: Secondary | ICD-10-CM | POA: Diagnosis not present

## 2023-07-16 DIAGNOSIS — R92323 Mammographic fibroglandular density, bilateral breasts: Secondary | ICD-10-CM | POA: Diagnosis not present

## 2023-07-16 DIAGNOSIS — Z1231 Encounter for screening mammogram for malignant neoplasm of breast: Secondary | ICD-10-CM | POA: Diagnosis not present

## 2023-07-22 DIAGNOSIS — A0472 Enterocolitis due to Clostridium difficile, not specified as recurrent: Secondary | ICD-10-CM | POA: Diagnosis not present

## 2023-07-22 DIAGNOSIS — R110 Nausea: Secondary | ICD-10-CM | POA: Diagnosis not present

## 2023-07-22 DIAGNOSIS — K219 Gastro-esophageal reflux disease without esophagitis: Secondary | ICD-10-CM | POA: Diagnosis not present

## 2023-07-23 DIAGNOSIS — A0472 Enterocolitis due to Clostridium difficile, not specified as recurrent: Secondary | ICD-10-CM | POA: Diagnosis not present

## 2023-08-12 DIAGNOSIS — R197 Diarrhea, unspecified: Secondary | ICD-10-CM | POA: Diagnosis not present

## 2023-08-12 DIAGNOSIS — F332 Major depressive disorder, recurrent severe without psychotic features: Secondary | ICD-10-CM | POA: Diagnosis not present

## 2023-08-12 DIAGNOSIS — F5101 Primary insomnia: Secondary | ICD-10-CM | POA: Diagnosis not present

## 2023-08-12 DIAGNOSIS — F411 Generalized anxiety disorder: Secondary | ICD-10-CM | POA: Diagnosis not present

## 2023-08-13 DIAGNOSIS — M0579 Rheumatoid arthritis with rheumatoid factor of multiple sites without organ or systems involvement: Secondary | ICD-10-CM | POA: Diagnosis not present

## 2023-08-13 DIAGNOSIS — Z79899 Other long term (current) drug therapy: Secondary | ICD-10-CM | POA: Diagnosis not present

## 2023-08-13 DIAGNOSIS — D84821 Immunodeficiency due to drugs: Secondary | ICD-10-CM | POA: Diagnosis not present

## 2023-08-26 DIAGNOSIS — R1084 Generalized abdominal pain: Secondary | ICD-10-CM | POA: Diagnosis not present

## 2023-08-26 DIAGNOSIS — R110 Nausea: Secondary | ICD-10-CM | POA: Diagnosis not present

## 2023-08-26 DIAGNOSIS — R197 Diarrhea, unspecified: Secondary | ICD-10-CM | POA: Diagnosis not present

## 2023-08-26 DIAGNOSIS — Z8619 Personal history of other infectious and parasitic diseases: Secondary | ICD-10-CM | POA: Diagnosis not present

## 2023-08-28 DIAGNOSIS — A0472 Enterocolitis due to Clostridium difficile, not specified as recurrent: Secondary | ICD-10-CM | POA: Diagnosis not present

## 2023-09-05 DIAGNOSIS — R197 Diarrhea, unspecified: Secondary | ICD-10-CM | POA: Diagnosis not present

## 2023-09-05 DIAGNOSIS — R112 Nausea with vomiting, unspecified: Secondary | ICD-10-CM | POA: Diagnosis not present

## 2023-09-09 DIAGNOSIS — F411 Generalized anxiety disorder: Secondary | ICD-10-CM | POA: Diagnosis not present

## 2023-09-09 DIAGNOSIS — F332 Major depressive disorder, recurrent severe without psychotic features: Secondary | ICD-10-CM | POA: Diagnosis not present

## 2023-09-09 DIAGNOSIS — F5101 Primary insomnia: Secondary | ICD-10-CM | POA: Diagnosis not present

## 2023-09-30 DIAGNOSIS — E785 Hyperlipidemia, unspecified: Secondary | ICD-10-CM | POA: Diagnosis not present

## 2023-09-30 DIAGNOSIS — E559 Vitamin D deficiency, unspecified: Secondary | ICD-10-CM | POA: Diagnosis not present

## 2023-10-04 DIAGNOSIS — R112 Nausea with vomiting, unspecified: Secondary | ICD-10-CM | POA: Diagnosis not present

## 2023-10-07 DIAGNOSIS — E559 Vitamin D deficiency, unspecified: Secondary | ICD-10-CM | POA: Diagnosis not present

## 2023-10-07 DIAGNOSIS — E785 Hyperlipidemia, unspecified: Secondary | ICD-10-CM | POA: Diagnosis not present

## 2023-10-07 DIAGNOSIS — I10 Essential (primary) hypertension: Secondary | ICD-10-CM | POA: Diagnosis not present

## 2023-10-07 DIAGNOSIS — Z0289 Encounter for other administrative examinations: Secondary | ICD-10-CM | POA: Diagnosis not present

## 2023-10-07 DIAGNOSIS — L659 Nonscarring hair loss, unspecified: Secondary | ICD-10-CM | POA: Diagnosis not present

## 2023-10-17 DIAGNOSIS — L821 Other seborrheic keratosis: Secondary | ICD-10-CM | POA: Diagnosis not present

## 2023-10-29 DIAGNOSIS — R531 Weakness: Secondary | ICD-10-CM | POA: Diagnosis not present

## 2023-11-04 DIAGNOSIS — F5101 Primary insomnia: Secondary | ICD-10-CM | POA: Diagnosis not present

## 2023-11-04 DIAGNOSIS — F411 Generalized anxiety disorder: Secondary | ICD-10-CM | POA: Diagnosis not present

## 2023-11-04 DIAGNOSIS — F332 Major depressive disorder, recurrent severe without psychotic features: Secondary | ICD-10-CM | POA: Diagnosis not present

## 2023-11-12 DIAGNOSIS — Z111 Encounter for screening for respiratory tuberculosis: Secondary | ICD-10-CM | POA: Diagnosis not present

## 2023-11-12 DIAGNOSIS — Z13818 Encounter for screening for other digestive system disorders: Secondary | ICD-10-CM | POA: Diagnosis not present

## 2023-11-12 DIAGNOSIS — Z79899 Other long term (current) drug therapy: Secondary | ICD-10-CM | POA: Diagnosis not present

## 2023-11-12 DIAGNOSIS — M0579 Rheumatoid arthritis with rheumatoid factor of multiple sites without organ or systems involvement: Secondary | ICD-10-CM | POA: Diagnosis not present

## 2023-11-12 DIAGNOSIS — I10 Essential (primary) hypertension: Secondary | ICD-10-CM | POA: Diagnosis not present
